# Patient Record
Sex: Male | Born: 1940 | Race: White | Hispanic: No | Marital: Married | Smoking: Never smoker
Health system: Southern US, Community
[De-identification: ages and names within clinical notes are randomized; demographics above are authoritative.]

## PROBLEM LIST (undated history)

## (undated) DIAGNOSIS — I714 Abdominal aortic aneurysm, without rupture, unspecified: Secondary | ICD-10-CM

## (undated) DIAGNOSIS — Z87442 Personal history of urinary calculi: Secondary | ICD-10-CM

## (undated) DIAGNOSIS — I4891 Unspecified atrial fibrillation: Secondary | ICD-10-CM

## (undated) DIAGNOSIS — I10 Essential (primary) hypertension: Secondary | ICD-10-CM

## (undated) DIAGNOSIS — G4730 Sleep apnea, unspecified: Secondary | ICD-10-CM

## (undated) HISTORY — PX: WISDOM TOOTH EXTRACTION: SHX21

## (undated) HISTORY — PX: NO PAST SURGERIES: SHX2092

---

## 2019-03-02 ENCOUNTER — Other Ambulatory Visit: Payer: Self-pay | Admitting: Neurological Surgery

## 2019-03-02 DIAGNOSIS — M4646 Discitis, unspecified, lumbar region: Secondary | ICD-10-CM

## 2019-03-18 ENCOUNTER — Ambulatory Visit
Admission: RE | Admit: 2019-03-18 | Discharge: 2019-03-18 | Disposition: A | Discharge status: Home / Self Care | Payer: Medicare Other | Source: Ambulatory Visit | Attending: Neurological Surgery | Admitting: Neurological Surgery

## 2019-03-18 DIAGNOSIS — M4646 Discitis, unspecified, lumbar region: Secondary | ICD-10-CM

## 2019-03-19 ENCOUNTER — Other Ambulatory Visit: Payer: Self-pay | Admitting: Neurological Surgery

## 2019-04-08 NOTE — Pre-Procedure Instructions (Signed)
Argle Mica  04/08/2019      [id number] - [place],  - [address] AT [facility] OF [address] [place] [address] Phone: [phone] Fax: [phone]    Your procedure is scheduled on 04/13/19.  Report to [facility] Admitting at 0930 A.M.  Call this number if you have problems the morning of surgery:  [phone]   Remember:  Do not eat or drink after midnight.     Take these medicines the morning of surgery with A SIP OF WATER ---PACERONE,METOPROLOL,OXCODONE,DOXYCYCLINE    Do not wear jewelry, make-up or nail polish.  Do not wear lotions, powders, or perfumes, or deodorant.  Do not shave 48 hours prior to surgery.  Men may shave face and neck.  Do not bring valuables to the hospital.  [facility] is not responsible for any belongings or valuables.  Contacts, dentures or bridgework may not be worn into surgery.  Leave your suitcase in the car.  After surgery it may be brought to your room.  For patients admitted to the hospital, discharge time will be determined by your treatment team.  Patients discharged the day of surgery will not be allowed to drive home.    Special instructions:  Do not take any aspirin,anti-inflammatories,vitamins,or herbal supplements 5-7 days prior to surgery. [facility] - Preparing for Surgery  Before surgery, you can play an important role.  Because skin is not sterile, your skin needs to be as free of germs as possible.  You can reduce the number of germs on you skin by washing with CHG (chlorahexidine gluconate) soap before surgery.  CHG is an antiseptic cleaner which kills germs and bonds with the skin to continue killing germs even after washing.  Oral Hygiene is also important in reducing the risk of infection.  Remember to brush your teeth with your regular toothpaste the morning of surgery.  Please DO NOT use if you have an allergy to CHG or antibacterial soaps.  If your skin  becomes reddened/irritated stop using the CHG and inform your nurse when you arrive at Short Stay.  Do not shave (including legs and underarms) for at least 48 hours prior to the first CHG shower.  You may shave your face.  Please follow these instructions carefully:   1.  Shower with CHG Soap the night before surgery and the morning of Surgery.  2.  If you choose to wash your hair, wash your hair first as usual with your normal shampoo.  3.  After you shampoo, rinse your hair and body thoroughly to remove the shampoo. 4.  Use CHG as you would any other liquid soap.  You can apply chg directly to the skin and wash gently with a      scrungie or washcloth.           5.  Apply the CHG Soap to your body ONLY FROM THE NECK DOWN.   Do not use on open wounds or open sores. Avoid contact with your eyes, ears, mouth and genitals (private parts).  Wash genitals (private parts) with your normal soap.  6.  Wash thoroughly, paying special attention to the area where your surgery will be performed.  7.  Thoroughly rinse your body with warm water from the neck down.  8.  DO NOT shower/wash with your normal soap after using and rinsing off the CHG Soap.  9.  Pat yourself dry with a  clean towel.            10.  Wear clean pajamas.            11.  Place clean sheets on your bed the night of your first shower and do not sleep with pets.  Day of Surgery  Do not apply any lotions/deoderants the morning of surgery.   Please wear clean clothes to the hospital/surgery center. Remember to brush your teeth with toothpaste.   Please read over the following fact sheets that you were given. MRSA Information

## 2019-04-09 ENCOUNTER — Encounter (HOSPITAL_COMMUNITY): Payer: Self-pay

## 2019-04-09 ENCOUNTER — Ambulatory Visit (HOSPITAL_COMMUNITY)
Admission: RE | Admit: 2019-04-09 | Discharge: 2019-04-09 | Disposition: A | Discharge status: Home / Self Care | Payer: Medicare Other | Source: Ambulatory Visit | Attending: Neurological Surgery | Admitting: Neurological Surgery

## 2019-04-09 ENCOUNTER — Encounter (HOSPITAL_COMMUNITY)
Admission: RE | Admit: 2019-04-09 | Discharge: 2019-04-09 | Disposition: A | Discharge status: Home / Self Care | Payer: Medicare Other | Source: Ambulatory Visit | Attending: Neurological Surgery | Admitting: Neurological Surgery

## 2019-04-09 ENCOUNTER — Other Ambulatory Visit (HOSPITAL_COMMUNITY)
Admission: RE | Admit: 2019-04-09 | Discharge: 2019-04-09 | Disposition: A | Discharge status: Home / Self Care | Payer: Medicare Other | Source: Ambulatory Visit | Attending: Neurological Surgery | Admitting: Neurological Surgery

## 2019-04-09 ENCOUNTER — Other Ambulatory Visit: Payer: Self-pay

## 2019-04-09 DIAGNOSIS — M4640 Discitis, unspecified, site unspecified: Secondary | ICD-10-CM | POA: Insufficient documentation

## 2019-04-09 DIAGNOSIS — Z20828 Contact with and (suspected) exposure to other viral communicable diseases: Secondary | ICD-10-CM | POA: Diagnosis not present

## 2019-04-09 HISTORY — DX: Sleep apnea, unspecified: G47.30

## 2019-04-09 HISTORY — DX: Abdominal aortic aneurysm, without rupture: I71.4

## 2019-04-09 HISTORY — DX: Abdominal aortic aneurysm, without rupture, unspecified: I71.40

## 2019-04-09 HISTORY — DX: Essential (primary) hypertension: I10

## 2019-04-09 LAB — CBC WITH DIFFERENTIAL/PLATELET
Abs Immature Granulocytes: 0.04 10*3/uL (ref 0.00–0.07)
Basophils Absolute: 0.1 10*3/uL (ref 0.0–0.1)
Basophils Relative: 1 %
Eosinophils Absolute: 0.3 10*3/uL (ref 0.0–0.5)
Eosinophils Relative: 4 %
HCT: 29.2 % — ABNORMAL LOW (ref 39.0–52.0)
Hemoglobin: 9.1 g/dL — ABNORMAL LOW (ref 13.0–17.0)
Immature Granulocytes: 1 %
Lymphocytes Relative: 18 %
Lymphs Abs: 1.2 10*3/uL (ref 0.7–4.0)
MCH: 26 pg (ref 26.0–34.0)
MCHC: 31.2 g/dL (ref 30.0–36.0)
MCV: 83.4 fL (ref 80.0–100.0)
Monocytes Absolute: 0.6 10*3/uL (ref 0.1–1.0)
Monocytes Relative: 10 %
Neutro Abs: 4.4 10*3/uL (ref 1.7–7.7)
Neutrophils Relative %: 66 %
Platelets: 188 10*3/uL (ref 150–400)
RBC: 3.5 MIL/uL — ABNORMAL LOW (ref 4.22–5.81)
RDW: 16.8 % — ABNORMAL HIGH (ref 11.5–15.5)
WBC: 6.6 10*3/uL (ref 4.0–10.5)
nRBC: 0 % (ref 0.0–0.2)

## 2019-04-09 LAB — BASIC METABOLIC PANEL
Anion gap: 7 (ref 5–15)
BUN: 24 mg/dL — ABNORMAL HIGH (ref 8–23)
CO2: 26 mmol/L (ref 22–32)
Calcium: 9.9 mg/dL (ref 8.9–10.3)
Chloride: 103 mmol/L (ref 98–111)
Creatinine, Ser: 1.21 mg/dL (ref 0.61–1.24)
GFR calc Af Amer: >60 mL/min (ref >60)
GFR calc non Af Amer: 57 mL/min — ABNORMAL LOW (ref >60)
Glucose, Bld: 100 mg/dL — ABNORMAL HIGH (ref 70–99)
Potassium: 4.5 mmol/L (ref 3.5–5.1)
Sodium: 136 mmol/L (ref 135–145)

## 2019-04-09 LAB — PROTIME-INR
INR: 1.5 — ABNORMAL HIGH (ref 0.8–1.2)
Prothrombin Time: 17.7 seconds — ABNORMAL HIGH (ref 11.4–15.2)

## 2019-04-09 LAB — SURGICAL PCR SCREEN
MRSA, PCR: NEGATIVE
Staphylococcus aureus: NEGATIVE

## 2019-04-09 LAB — SARS CORONAVIRUS 2 (TAT 6-24 HRS): SARS Coronavirus 2: NEGATIVE

## 2019-04-09 MED ORDER — CHLORHEXIDINE GLUCONATE CLOTH 2 % EX PADS: 6.0000 | MEDICATED_PAD | Freq: Once | CUTANEOUS | Status: DC

## 2019-04-09 NOTE — Final Progress Note (Signed)
PCP - jeffery vanderbury with [facility]--will call for cardiac clearance Cardiologist - na Chest x-ray - 7/20 EKG - 7/20 Stress Test - na ECHO na-  Cardiac Cath - na  Sleep Study - yes CPAP yes -   Blood Thinner Instructions:  Stop xarelto 7/29 Aspirin Instructions:stop  Anesthesia review: review heart hx  Patient denies shortness of breath, fever, cough and chest pain at PAT appointment   Patient verbalized understanding of instructions that were given to them at the PAT appointment. Patient was also instructed that they will need to review over the PAT instructions again at home before surgery.

## 2019-04-10 ENCOUNTER — Encounter (HOSPITAL_COMMUNITY): Payer: Self-pay | Admitting: Vascular Surgery

## 2019-04-10 ENCOUNTER — Encounter (HOSPITAL_COMMUNITY): Payer: Self-pay

## 2019-04-10 ENCOUNTER — Encounter (HOSPITAL_COMMUNITY): Payer: Self-pay | Admitting: Physician Assistant

## 2019-04-10 NOTE — Progress Notes (Addendum)
Anesthesia Chart Review:  Case: [id number] Date/Time: 04/13/19 1112   Procedure: Posterior Lateral Fusion with segmental instrumentation T10-L4 (N/A )   Anesthesia type: General   Pre-op diagnosis: Discitis   Location: [facility] OR ROOM 59 / [facility]   Surgeon: Eustace Moore, MD      DISCUSSION: 78 yo male with pertinent hx of HTN, OSA on CPAP, Afib (new onset 01/2019), recent admission for complicated disseminated staph infection.   Admitted to [place] 4/25-01/12/19 for disseminated Staph epidermidis infection (pyelonephritis, bacteremia, epidural abscess with discitis/osteomyelitis). 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 He completed 8-week course of cefazolin on 03/04/2019 and was transitioned to oral continuation therapy with doxycycline thereafter. He has continued to follow with ID and Urology.  During hospitalization he was found to have new onset Afib with RVR. Cardiology was consulted. CTA chest showed prominent coronary artery calcifications. Echo showed reduced LV function with EF 30-35%. Unclear if depressed EF due to Afib or ischemia. Notes during admission state he may need outpatient ischemic workup and/or cardioversion. He was started on metoprolol and xarelto and advised to f/u outpatient with cardiology but has not yet done so.   AAA listed in diagnosis history, however CT abdomen 01/03/19 showed atherosclerosis but no aneurysm.   Discussed case with Dr. Tobias Alexander. He advised that ideally the pt would be evaluated by cardiology given new onset afib and depressed EF with significant coronary calcification seen on CT. However, if the pt is at risk for imminent neurologic decline and the surgery is deemed emergent by Dr. Ronnald Ramp they could  proceed as planned but will need DOS eval by anesthesia.   Spoke with Lorriane Shire at Dr. Ronnald Ramp' office, she relayed that Dr. Ronnald Ramp did feel this case needed to be done soon. She will discuss with him regarding proceeding.   Preop labs notable for Hgb 9.1. Type and screen should have been done at PAT but was evidently not ordered. Will need to be done DOS.    VS: BP (!) 98/57   Pulse 67   Temp (!) 36.3 C (Oral)   Resp 18   Ht 6' (1.829 m)   Wt 99.1 kg   SpO2 98%   BMI 29.62 kg/m   PROVIDERS: Mackey Birchwood, NP is PCP  Dionne Milo, MD is Pulmonologist  Sharene Skeans, MD is ID  LABS: Anemia noted (all labs ordered are listed, but only abnormal results are displayed)  Labs Reviewed  BASIC METABOLIC PANEL - Abnormal; Notable for the following components:      Result Value   Glucose, Bld 100 (*)    BUN 24 (*)    GFR calc non Af Amer 57 (*)    All other components within normal limits  CBC WITH DIFFERENTIAL/PLATELET - Abnormal; Notable for the following components:   RBC 3.50 (*)    Hemoglobin 9.1 (*)    HCT 29.2 (*)    RDW 16.8 (*)    All other components within normal limits  PROTIME-INR - Abnormal; Notable for the following components:   Prothrombin Time 17.7 (*)    INR 1.5 (*)    All other components within normal limits  SURGICAL PCR SCREEN     IMAGES: CHEST - 2 VIEW 04/09/2019:  COMPARISON:  None  FINDINGS:  Enlargement of cardiac silhouette.  Atherosclerotic calcification and elongation of thoracic aorta.  Mediastinal contours and pulmonary vascularity otherwise normal.  Minimal bibasilar atelectasis.  Lungs otherwise clear.  No pleural effusion or pneumothorax.  Bones demineralized.  IMPRESSION: Minimal bibasilar atelectasis.  Enlargement of cardiac silhouette.  Lumbar MRI 03/18/2019: IMPRESSION: 1. Destructive changes of the majority of the L1 vertebra with vertebral body collapse, as seen on the prior MRI, most  consistent with sequela of spondylo-discitis. There is approximately 12 mm retropulsion of bone and soft tissues at the level of L1-L2 with associated moderate focal narrowing of the central canal. These findings are similar to the MRI of 02/18/2019. 2. No new/acute fractures identified. 3. Multilevel degenerative disc disease including diffuse disc bulge at L3-L4 with associated bilateral neural foramina narrowing. 4. Osteopenia. 5. Multiple nonobstructing right renal calculi as well as a nonobstructing stone in the mid right ureter. Moderate right hydronephrosis. Partially visualized right ureteral stent with proximal tip in the region of the right UPJ. 6.  Aortic Atherosclerosis (ICD10-I70.0).  EKG: 04/09/2019: Atrial fibrillation. Rate 64. Left axis deviation. Right bundle branch block  CV: TTE 01/04/19 (care everywhere): The left ventricle is normal in size. There is no thrombus. There is normal left ventricular wall thickness. The left ventricular ejection fraction is markedly reduced (30-35%). Unable to adequately determine diastolic dysfunction. There is mild (1+) mitral regurgitation. The aortic valve is trileaflet. Mild aortic sclerosis is present with good valvular opening. There is mild (1+) tricuspid regurgitation. The left atrium is mildly dilated.   Past Medical History:  Diagnosis Date  . AAA (abdominal aortic aneurysm) (HCC)   . Hypertension   . Kidney stones   . Sleep apnea    CPAP    Past Surgical History:  Procedure Laterality Date  . NO PAST SURGERIES      MEDICATIONS: . amiodarone (PACERONE) 200 MG tablet  . atorvastatin (LIPITOR) 40 MG tablet  . bisacodyl (DULCOLAX) 5 MG EC tablet  . doxycycline (VIBRAMYCIN) 100 MG capsule  . furosemide (LASIX) 40 MG tablet  . latanoprost (XALATAN) 0.005 % ophthalmic solution  . metoprolol succinate (TOPROL-XL) 25 MG 24 hr tablet  . metoprolol succinate (TOPROL-XL) 50 MG 24 hr tablet  . Multiple Vitamin  (MULTIVITAMIN WITH MINERALS) TABS tablet  . Oxycodone HCl 10 MG TABS  . polyethylene glycol (MIRALAX / GLYCOLAX) 17 g packet  . rivaroxaban (XARELTO) 20 MG TABS tablet   No current facility-administered medications for this encounter.

## 2019-04-10 NOTE — Progress Notes (Signed)
See note by Karoline Caldwell, PA-C. Voice message received from [place] after 5:00 PM. Dr. Ronnald Ramp is leaving case on the schedule (for 04/13/2019) to "assess day of surgery."   Myra Gianotti, PA-C Surgical Short Stay/Anesthesiology [facility] Phone [phone] [facility] Phone [phone] 04/10/2019 6:42 PM

## 2019-04-10 NOTE — Anesthesia Preprocedure Evaluation (Deleted)
Anesthesia Evaluation    Airway        Dental   Pulmonary           Cardiovascular hypertension,      Neuro/Psych    GI/Hepatic   Endo/Other    Renal/GU      Musculoskeletal   Abdominal   Peds  Hematology   Anesthesia Other Findings   Reproductive/Obstetrics                             Anesthesia Physical Anesthesia Plan  ASA:   Anesthesia Plan:    Post-op Pain Management:    Induction:   PONV Risk Score and Plan:   Airway Management Planned:   Additional Equipment:   Intra-op Plan:   Post-operative Plan:   Informed Consent:   Plan Discussed with:   Anesthesia Plan Comments: (See PAT note by Karoline Caldwell, PA-C. Case cancelled for Cardiology w/u. )      Anesthesia Quick Evaluation

## 2019-04-13 ENCOUNTER — Encounter (HOSPITAL_COMMUNITY): Payer: Self-pay | Admitting: Certified Registered Nurse Anesthetist

## 2019-04-13 ENCOUNTER — Inpatient Hospital Stay (HOSPITAL_COMMUNITY)
Admission: RE | Admit: 2019-04-13 | Discharge: 2019-04-18 | DRG: 457 | Disposition: A | Discharge status: Home Health | Payer: Medicare Other | Attending: Neurological Surgery | Admitting: Neurological Surgery

## 2019-04-13 ENCOUNTER — Encounter (HOSPITAL_COMMUNITY)
Admission: RE | Disposition: A | Discharge status: Home Health | Payer: Self-pay | Source: Home / Self Care | Attending: Neurological Surgery

## 2019-04-13 ENCOUNTER — Inpatient Hospital Stay (HOSPITAL_COMMUNITY): Payer: Medicare Other

## 2019-04-13 ENCOUNTER — Other Ambulatory Visit: Payer: Self-pay

## 2019-04-13 DIAGNOSIS — I110 Hypertensive heart disease with heart failure: Secondary | ICD-10-CM | POA: Diagnosis present

## 2019-04-13 DIAGNOSIS — I4891 Unspecified atrial fibrillation: Secondary | ICD-10-CM | POA: Diagnosis present

## 2019-04-13 DIAGNOSIS — M4646 Discitis, unspecified, lumbar region: Secondary | ICD-10-CM | POA: Diagnosis present

## 2019-04-13 DIAGNOSIS — I5022 Chronic systolic (congestive) heart failure: Secondary | ICD-10-CM | POA: Diagnosis present

## 2019-04-13 DIAGNOSIS — I4821 Permanent atrial fibrillation: Secondary | ICD-10-CM | POA: Diagnosis not present

## 2019-04-13 DIAGNOSIS — Z01810 Encounter for preprocedural cardiovascular examination: Secondary | ICD-10-CM | POA: Diagnosis not present

## 2019-04-13 DIAGNOSIS — M4640 Discitis, unspecified, site unspecified: Secondary | ICD-10-CM | POA: Diagnosis present

## 2019-04-13 DIAGNOSIS — I4811 Longstanding persistent atrial fibrillation: Secondary | ICD-10-CM | POA: Diagnosis not present

## 2019-04-13 DIAGNOSIS — I252 Old myocardial infarction: Secondary | ICD-10-CM

## 2019-04-13 DIAGNOSIS — M40205 Unspecified kyphosis, thoracolumbar region: Secondary | ICD-10-CM | POA: Diagnosis present

## 2019-04-13 DIAGNOSIS — G4730 Sleep apnea, unspecified: Secondary | ICD-10-CM | POA: Diagnosis present

## 2019-04-13 DIAGNOSIS — Z87442 Personal history of urinary calculi: Secondary | ICD-10-CM | POA: Diagnosis not present

## 2019-04-13 DIAGNOSIS — Z419 Encounter for procedure for purposes other than remedying health state, unspecified: Secondary | ICD-10-CM

## 2019-04-13 DIAGNOSIS — I429 Cardiomyopathy, unspecified: Secondary | ICD-10-CM

## 2019-04-13 DIAGNOSIS — I428 Other cardiomyopathies: Secondary | ICD-10-CM | POA: Diagnosis present

## 2019-04-13 DIAGNOSIS — Z8679 Personal history of other diseases of the circulatory system: Secondary | ICD-10-CM

## 2019-04-13 DIAGNOSIS — M545 Low back pain: Secondary | ICD-10-CM | POA: Diagnosis present

## 2019-04-13 DIAGNOSIS — Z79899 Other long term (current) drug therapy: Secondary | ICD-10-CM | POA: Diagnosis not present

## 2019-04-13 DIAGNOSIS — Z7901 Long term (current) use of anticoagulants: Secondary | ICD-10-CM

## 2019-04-13 DIAGNOSIS — Z981 Arthrodesis status: Secondary | ICD-10-CM

## 2019-04-13 HISTORY — DX: Unspecified atrial fibrillation: I48.91

## 2019-04-13 HISTORY — DX: Personal history of urinary calculi: Z87.442

## 2019-04-13 LAB — ECHOCARDIOGRAM COMPLETE
Height: 72 in
Weight: 3280 oz

## 2019-04-13 SURGERY — LAMINECTOMY WITH POSTERIOR LATERAL ARTHRODESIS LEVEL 4
Anesthesia: General

## 2019-04-13 MED ORDER — POTASSIUM CHLORIDE IN NACL 20-0.9 MEQ/L-% IV SOLN
INTRAVENOUS | Status: DC
  Administered 2019-04-13: 15:00:00 via INTRAVENOUS
  Filled 2019-04-13: qty 1000

## 2019-04-13 MED ORDER — METOPROLOL SUCCINATE ER 50 MG PO TB24
50.0000 mg | ORAL_TABLET | Freq: Every day | ORAL | Status: DC
  Administered 2019-04-14 – 2019-04-18 (×4): 50 mg via ORAL
  Filled 2019-04-13 (×7): qty 1

## 2019-04-13 MED ORDER — FUROSEMIDE 40 MG PO TABS
40.0000 mg | ORAL_TABLET | Freq: Every day | ORAL | Status: DC
  Administered 2019-04-13 – 2019-04-16 (×4): 40 mg via ORAL
  Filled 2019-04-13 (×4): qty 1

## 2019-04-13 MED ORDER — DOXYCYCLINE HYCLATE 100 MG PO TABS
100.0000 mg | ORAL_TABLET | Freq: Two times a day (BID) | ORAL | Status: DC
  Administered 2019-04-13 – 2019-04-18 (×11): 100 mg via ORAL
  Filled 2019-04-13 (×11): qty 1

## 2019-04-13 MED ORDER — ADULT MULTIVITAMIN W/MINERALS CH
1.0000 | ORAL_TABLET | Freq: Every day | ORAL | Status: DC
  Administered 2019-04-13 – 2019-04-18 (×5): 1 via ORAL
  Filled 2019-04-13 (×5): qty 1

## 2019-04-13 MED ORDER — ACETAMINOPHEN 650 MG RE SUPP: 650.0000 mg | Freq: Four times a day (QID) | RECTAL | Status: DC | PRN

## 2019-04-13 MED ORDER — CEFAZOLIN SODIUM-DEXTROSE 2-4 GM/100ML-% IV SOLN
INTRAVENOUS | Status: AC
  Filled 2019-04-13: qty 100

## 2019-04-13 MED ORDER — ACETAMINOPHEN 325 MG PO TABS: 650.0000 mg | ORAL_TABLET | Freq: Four times a day (QID) | ORAL | Status: DC | PRN

## 2019-04-13 MED ORDER — SODIUM CHLORIDE 0.9% FLUSH
3.0000 mL | Freq: Two times a day (BID) | INTRAVENOUS | Status: DC
  Administered 2019-04-13 (×2): 3 mL via INTRAVENOUS

## 2019-04-13 MED ORDER — PERFLUTREN LIPID MICROSPHERE
1.0000 mL | INTRAVENOUS | Status: AC | PRN
  Filled 2019-04-13: qty 10

## 2019-04-13 MED ORDER — LATANOPROST 0.005 % OP SOLN
1.0000 [drp] | Freq: Every day | OPHTHALMIC | Status: DC
  Administered 2019-04-13 – 2019-04-17 (×5): 1 [drp] via OPHTHALMIC
  Filled 2019-04-13: qty 2.5

## 2019-04-13 MED ORDER — PERFLUTREN LIPID MICROSPHERE
INTRAVENOUS | Status: AC
  Filled 2019-04-13: qty 10

## 2019-04-13 MED ORDER — REGADENOSON 0.4 MG/5ML IV SOLN
0.4000 mg | Freq: Once | INTRAVENOUS | Status: AC
  Administered 2019-04-14: 0.4 mg via INTRAVENOUS
  Filled 2019-04-13: qty 5

## 2019-04-13 MED ORDER — AMIODARONE HCL 200 MG PO TABS
200.0000 mg | ORAL_TABLET | Freq: Every day | ORAL | Status: DC
  Administered 2019-04-13: 15:00:00 200 mg via ORAL
  Filled 2019-04-13: qty 1

## 2019-04-13 MED ORDER — METOPROLOL SUCCINATE ER 25 MG PO TB24
25.0000 mg | ORAL_TABLET | Freq: Every day | ORAL | Status: DC
  Administered 2019-04-14 – 2019-04-16 (×2): 25 mg via ORAL
  Filled 2019-04-13 (×4): qty 1

## 2019-04-13 MED ORDER — CEFAZOLIN SODIUM-DEXTROSE 2-4 GM/100ML-% IV SOLN: 2.0000 g | INTRAVENOUS | Status: DC

## 2019-04-13 NOTE — Progress Notes (Signed)
Patient to self-administer CPAP for HS using his machine from home.  Patient is familiar with equipment and procedure.  His machine is ready for use and within reach.  Patient informed to contact RN or RT with any further needs or requests.

## 2019-04-13 NOTE — Progress Notes (Signed)
  Echocardiogram 2D Echocardiogram has been performed.  Jake Johnson 04/13/2019, 2:16 PM

## 2019-04-13 NOTE — H&P (Signed)
Subjective: Patient is a 78 y.o. male admitted for L1-2 diskitis and vertebral body destruction. Onset of symptoms was several weeks ago, gradually worsening since that time.  The pain is rated severe, and is located at the across the lower back. The pain is described as aching and occurs all day. The symptoms have been progressive. Symptoms are exacerbated by exercise. MRI or CT showed L1-2 diskitis with vertebral destruction   Past Medical History:  Diagnosis Date  . AAA (abdominal aortic aneurysm) ([place])   . Hypertension   . Kidney stones   . Sleep apnea    CPAP    Past Surgical History:  Procedure Laterality Date  . NO PAST SURGERIES      Prior to Admission medications   Medication Sig Start Date End Date Taking? Authorizing Provider  amiodarone (PACERONE) 200 MG tablet Take 200 mg by mouth daily.   Yes [provider]  atorvastatin (LIPITOR) 40 MG tablet Take 40 mg by mouth at bedtime.   Yes [provider]  bisacodyl (DULCOLAX) 5 MG EC tablet Take 5 mg by mouth daily.   Yes [provider]  doxycycline (VIBRAMYCIN) 100 MG capsule Take 100 mg by mouth 2 (two) times daily.   Yes [provider]  furosemide (LASIX) 40 MG tablet Take 40 mg by mouth daily.   Yes [provider]  latanoprost (XALATAN) 0.005 % ophthalmic solution Place 1 drop into both eyes at bedtime.   Yes [provider]  metoprolol succinate (TOPROL-XL) 25 MG 24 hr tablet Take 25 mg by mouth at bedtime.   Yes [provider]  metoprolol succinate (TOPROL-XL) 50 MG 24 hr tablet Take 50 mg by mouth daily.   Yes [provider]  Multiple Vitamin (MULTIVITAMIN WITH MINERALS) TABS tablet Take 1 tablet by mouth daily.   Yes [provider]  Oxycodone HCl 10 MG TABS Take 10 mg by mouth every 6 (six) hours as needed (pain).   Yes [provider]  polyethylene glycol (MIRALAX / GLYCOLAX) 17 g packet Take 17 g by mouth daily.   Yes [provider]  rivaroxaban (XARELTO) 20 MG TABS tablet Take 20 mg by mouth daily.   Yes [provider]   No Known Allergies  Social History   Tobacco Use  . Smoking status: Not on file  Substance Use Topics  . Alcohol use: Never    Frequency: Never    History reviewed. No pertinent family history.   Review of Systems  Positive ROS: neg  All other systems have been reviewed and were otherwise negative with the exception of those mentioned in the HPI and as above.  Objective: Vital signs in last 24 hours: Temp:  [98.3 F (36.8 C)] 98.3 F (36.8 C) (08/03 0933) Pulse Rate:  [72] 72 (08/03 0933) Resp:  [18] 18 (08/03 0933) BP: (128)/(50) 128/50 (08/03 0933) SpO2:  [100 %] 100 % (08/03 0933) Weight:  [93 kg] 93 kg (08/03 0933)  General Appearance: Alert, cooperative, no distress, appears stated age Head: Normocephalic, without obvious abnormality, atraumatic Eyes: PERRL, conjunctiva/corneas clear, EOM's intact    Neck: Supple, symmetrical, trachea midline Back: Symmetric, no curvature, ROM normal, no CVA tenderness Lungs:  respirations unlabored Heart: Regular rate and rhythm Abdomen: Soft, non-tender Extremities: Extremities normal, atraumatic, no cyanosis or edema Pulses: 2+ and symmetric all extremities Skin: Skin color, texture, turgor normal, no rashes or lesions  NEUROLOGIC:   Mental status: Alert and oriented x4,  no aphasia, good  attention span, fund of knowledge, and memory Motor Exam - grossly normal Sensory Exam - grossly normal Reflexes: trace Coordination - grossly normal Gait - grossly normal Balance - grossly normal Cranial Nerves: I: smell Not tested  II: visual acuity  OS: nl    OD: nl  II: visual fields Full to confrontation  II: pupils Equal, round, reactive to light  III,VII: ptosis None  III,IV,VI: extraocular muscles  Full ROM  V: mastication Normal  V: facial light touch sensation  Normal  V,VII: corneal reflex  Present  VII:  facial muscle function - upper  Normal  VII: facial muscle function - lower Normal  VIII: hearing Not tested  IX: soft palate elevation  Normal  IX,X: gag reflex Present  XI: trapezius strength  5/5  XI: sternocleidomastoid strength 5/5  XI: neck flexion strength  5/5  XII: tongue strength  Normal    Data Review Lab Results  Component Value Date   WBC 6.6 04/09/2019   HGB 9.1 (L) 04/09/2019   HCT 29.2 (L) 04/09/2019   MCV 83.4 04/09/2019   PLT 188 04/09/2019   Lab Results  Component Value Date   NA 136 04/09/2019   K 4.5 04/09/2019   CL 103 04/09/2019   CO2 26 04/09/2019   BUN 24 (H) 04/09/2019   CREATININE 1.21 04/09/2019   GLUCOSE 100 (H) 04/09/2019   Lab Results  Component Value Date   INR 1.5 (H) 04/09/2019    Assessment/Plan:  Estimated body mass index is 27.8 kg/m as calculated from the following:   Height as of this encounter: 6' (1.829 m).   Weight as of this encounter: 93 kg. Patient admitted for sequelae of L1-2 diskitis. Surgery will be delayed for cardiac work up and clearance. He will be admitted for this.  Patient has failed a reasonable attempt at conservative therapy and will ned stabilization surgery when cleared.Tia Alert 04/13/2019 11:05 AM

## 2019-04-13 NOTE — Consult Note (Addendum)
Cardiology Consultation:   Patient ID: Jake DuffWilliam Johnson MRN: [id number]; DOB: 03/25/1941  Admit date: 04/13/2019 Date of Consult: 04/13/2019  Primary Care Provider: Donzetta SprungVanderburg, Jeffrey, NP Primary Cardiologist: New to Dr. Antoine PocheHochrein   Patient Profile:   Jake DuffWilliam Johnson is a 78 y.o. male with a hx hypertension, sleep apnea previously on CPAP, AAA, kidney stone and recent diagnosis of atrial fibrillation of  who is being seen today for the evaluation of surgical clearance at the request of Dr. Yetta BarreJones.   He was [facility] 12/[facility] at [facility] for sepsis secondary to bacteremia, epidural abscess with osteomyelitis and pyelonephritis.  He was diagnosed with atrial fibrillation.  Cardiogram showed reduced LV function at 30 to 35%, mild mitral regurgitation.  He was anticoagulated with Eliquis.  No ischemic work-up done.  History of Present Illness:   Jake Johnson continue to have lower lumbar back pain.  Failed conservative therapy.  He was started on CPAP for sleep apnea by Randolm IdolAdnan Javaid, MD. 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  Due to cardiac risk factor, his surgery cancel and cardiology asked for surgical clearance.  Patient reports being very active prior to 6 months ago.  He was playing tennis and post moving yard without any issue.  No prior cardiac history.  Denies alcohol, tobacco or illicit drug use.  No orthopnea, PND, syncope, lower extremity edema, dizziness, chest pain or palpitation.  Father had pacemaker placement.  Heart Pathway Score:     Past Medical History:  Diagnosis Date  . AAA (abdominal aortic aneurysm) (HCC)   . Hypertension   . Kidney stones   . Sleep apnea    Does not use CPAP    Past Surgical History:  Procedure Laterality Date  . NO PAST SURGERIES       Inpatient Medications: Scheduled Meds: . amiodarone  200 mg Oral Daily  . doxycycline  100 mg Oral BID  . furosemide  40 mg Oral Daily  . latanoprost  1 drop Both Eyes QHS   . metoprolol succinate  25 mg Oral QHS  . [START ON 04/14/2019] metoprolol succinate  50 mg Oral Daily  . multivitamin with minerals  1 tablet Oral Daily  . sodium chloride flush  3 mL Intravenous Q12H   Continuous Infusions: . 0.9 % NaCl with KCl 20 mEq / L     PRN Meds: acetaminophen **OR** acetaminophen  Allergies:   No Known Allergies  Social History:   Social History   Socioeconomic History  . Marital status: Married    Spouse name: Not on file  . Number of children: Not on file  . Years of education: Not on file  . Highest education level: Not on file  Occupational History  . Not on file  Social Needs  . Financial resource strain: Not on file  . Food insecurity    Worry: Not on file    Inability: Not on file  . Transportation needs    Medical: Not on file    Non-medical: Not on file  Tobacco Use  . Smoking status: Never Smoker  . Smokeless tobacco: Never Used  Substance and Sexual Activity  . Alcohol use: Never    Frequency: Never  . Drug use: Never  . Sexual activity: Not on file  Lifestyle  . Physical activity    Days per week: Not on file    Minutes per session: Not on file  . Stress: Not on file  Relationships  . Social connections  Talks on phone: Not on file    Gets together: Not on file    Attends religious service: Not on file    Active member of club or organization: Not on file    Attends meetings of clubs or organizations: Not on file    Relationship status: Not on file  . Intimate partner violence    Fear of current or ex partner: Not on file    Emotionally abused: Not on file    Physically abused: Not on file    Forced sexual activity: Not on file  Other Topics Concern  . Not on file  Social History Narrative  . Not on file    Family History:   As above  ROS:  Please see the history of present illness.  All other ROS reviewed and negative.     Physical Exam/Data:   Vitals:   04/13/19 0933 04/13/19 1247  BP: (!) 128/50  107/75  Pulse: 72 (!) 56  Resp: 18 18  Temp: 98.3 F (36.8 C) 97.9 F (36.6 C)  TempSrc: Oral Oral  SpO2: 100% 100%  Weight: 93 kg   Height: 6' (1.829 m)    No intake or output data in the 24 hours ending 04/13/19 1334 Last 3 Weights 04/13/2019 04/09/2019  Weight (lbs) 205 lb 218 lb 6 oz  Weight (kg) 92.987 kg 99.054 kg     Body mass index is 27.8 kg/m.  General:  Well nourished, well developed, in no acute distress HEENT: normal Lymph: no adenopathy Neck: no JVD Endocrine:  No thryomegaly Vascular: No carotid bruits; FA pulses 2+ bilaterally without bruits  Cardiac:  normal S1, S2; RRR; no murmur  Lungs:  clear to auscultation bilaterally, no wheezing, rhonchi or rales  Abd: soft, nontender, no hepatomegaly  Ext: no edema Musculoskeletal:  No deformities, BUE and BLE strength normal and equal Skin: warm and dry  Neuro:  CNs 2-12 intact, no focal abnormalities noted Psych:  Normal affect   EKG:  The EKG was personally reviewed and demonstrates: Atrial fibrillation at rate of 84 bpm, right bundle branch block Telemetry:  Telemetry was personally reviewed and demonstrates: Atrial fibrillation at controlled ventricular rate  Relevant CV Studies:  Echo 12/2018 Interpretation Summary A complete portable two-dimensional transthoracic echocardiogram with color flow Doppler and Spectral Doppler was performed. The study was technically adequate. There is no comparison study available. Rhythm appears to be atrial fibrillatioin with bundle branch block and ventricular ectopy.  The left ventricle is normal in size. There is no thrombus. There is normal left ventricular wall thickness. The left ventricular ejection fraction is markedly reduced (30-35%). Unable to adequately determine diastolic dysfunction. There is mild (1+) mitral regurgitation. The aortic valve is trileaflet. Mild aortic sclerosis is present with good valvular opening. There is mild (1+) tricuspid regurgitation.  The left atrium is mildly dilated.  Laboratory Data:   Chemistry Recent Labs  Lab 04/09/19 1356  NA 136  K 4.5  CL 103  CO2 26  GLUCOSE 100*  BUN 24*  CREATININE 1.21  CALCIUM 9.9  GFRNONAA 57*  GFRAA >60  ANIONGAP 7    Hematology Recent Labs  Lab 04/09/19 1356  WBC 6.6  RBC 3.50*  HGB 9.1*  HCT 29.2*  MCV 83.4  MCH 26.0  MCHC 31.2  RDW 16.8*  PLT 188    Radiology/Studies:  Chest 2 View  Result Date: 04/09/2019 CLINICAL DATA:  Diskitis, back surgery Monday EXAM: CHEST - 2 VIEW COMPARISON:  None FINDINGS: Enlargement  of cardiac silhouette. Atherosclerotic calcification and elongation of thoracic aorta. Mediastinal contours and pulmonary vascularity otherwise normal. Minimal bibasilar atelectasis. Lungs otherwise clear. No pleural effusion or pneumothorax. Bones demineralized. IMPRESSION: Minimal bibasilar atelectasis. Enlargement of cardiac silhouette. Electronically Signed   By: Ulyses Southward M.D.   On: 04/09/2019 17:54    Assessment and Plan:   1. Persistent atrial fibrillation Diagnosed 12/2018.  Rate controlled on amiodarone 200 mg daily and Toprol XL 50 mg a.m. and 25 mg p.m.  CHADSVASC score of 3 for age and HTN. Previously on Eliquis, currently on Xarelto 20 mg daily (last dose Thursday  7/30).  2. HTN - BP stable on current medications  3. Chronic systolic CHF -LV function of 30-35% at outside hospital 12/2018.  Currently euvolemic. -Continue Lasix and beta-blocker -Consider adding ACE/ARB as outpatient or after surgery as blood pressure allows - Pending echo here -Suspects nonischemic cardiomyopathy  4.  Preop clearance -Recent finding of atrial fibrillation with reduced LV function to 30 to 35%.  Denies prior cardiac history.  Patient was very active last year playing tennis and doing push mowing the yard.  However unable to do so recently due to severe back issue.  Will get chemical stress test tomorrow prior to final surgical clearance.   For  questions or updates, please contact [facility] Please consult [email or web address] for contact info under   SignedManson Passey, PA  04/13/2019 1:34 PM   History and all data above reviewed.  Patient examined.  I agree with the findings as above. The patient has no past cardiac history.  He does have atrial fib which was noted this year.  He was at [facility] and was treated for sepsis.  At that time he was also noted to have a reduced EF.  He is not aware of this.  The patient denies any new symptoms such as chest discomfort, neck or arm discomfort. There has been no new shortness of breath, PND or orthopnea. There have been no reported palpitations, presyncope or syncope.  He would not notice that he was in fib. He is limited by his back but walks up stairs with a cain.    The patient exam reveals RUE:[id number]  ,  Lungs: Clear  ,  Abd: Positive bowel sounds, no rebound no guarding, Ext No edema  .  All available labs, radiology testing, previous records reviewed. Agree with documented assessment and plan. Atrial fib:  Rate controlled and no symptoms.  I will not likely plan any other strategy besides anticoagulation and rate control.  Cardiomyopathy:  EF was low at the time of an admission for sepsis.  Repeat echo is pending.  No prior ischemia work up.  He does need to have ischemia ruled out so I will suggest [facility] tomorrow prior to any elective back surgery.    Fayrene Fearing Nolan Lasser  1:51 PM  04/13/2019

## 2019-04-13 NOTE — Progress Notes (Signed)
Attempt to place peripheral iv failed. Iv team consult placed, however did not arrived at the time needed for ECHO denifity administration.  Iv placement attempted by second unit nurse and was successful. Called ECHO to let them know that iv is in place if needed.

## 2019-04-14 ENCOUNTER — Inpatient Hospital Stay (HOSPITAL_COMMUNITY): Payer: Medicare Other

## 2019-04-14 DIAGNOSIS — I4821 Permanent atrial fibrillation: Secondary | ICD-10-CM

## 2019-04-14 DIAGNOSIS — I4891 Unspecified atrial fibrillation: Secondary | ICD-10-CM

## 2019-04-14 LAB — NM MYOCAR MULTI W/SPECT W/WALL MOTION / EF
Estimated workload: 1 METS
MPHR: 141 {beats}/min
Peak HR: 84 {beats}/min
Percent HR: 59 %
Rest HR: 64 {beats}/min

## 2019-04-14 MED ORDER — TECHNETIUM TC 99M TETROFOSMIN IV KIT: 10.0000 | PACK | Freq: Once | INTRAVENOUS | Status: DC | PRN

## 2019-04-14 MED ORDER — REGADENOSON 0.4 MG/5ML IV SOLN
INTRAVENOUS | Status: AC
  Administered 2019-04-14: 10:00:00 0.4 mg via INTRAVENOUS
  Filled 2019-04-14: qty 5

## 2019-04-14 NOTE — Progress Notes (Signed)
Progress Note  Patient Name: Jake Johnson Date of Encounter: 04/14/2019  Primary Cardiologist:   No primary care provider on file.   Subjective   No chest pain.  No SOB.    Inpatient Medications    Scheduled Meds: . amiodarone  200 mg Oral Daily  . doxycycline  100 mg Oral BID  . furosemide  40 mg Oral Daily  . latanoprost  1 drop Both Eyes QHS  . metoprolol succinate  25 mg Oral QHS  . metoprolol succinate  50 mg Oral Daily  . multivitamin with minerals  1 tablet Oral Daily  . regadenoson  0.4 mg Intravenous Once  . sodium chloride flush  3 mL Intravenous Q12H   Continuous Infusions: . 0.9 % NaCl with KCl 20 mEq / L 10 mL/hr at 04/13/19 1454   PRN Meds: acetaminophen **OR** acetaminophen, technetium tetrofosmin   Vital Signs    Vitals:   04/14/19 0031 04/14/19 0438 04/14/19 0447 04/14/19 0744  BP: 97/67  105/71 113/67  Pulse: 61  (!) 45 71  Resp: 16  17 18   Temp:   98.6 F (37 C) 98.9 F (37.2 C)  TempSrc:   Oral Oral  SpO2: 100%  98% 100%  Weight:  96.6 kg    Height:        Intake/Output Summary (Last 24 hours) at 04/14/2019 0836 Last data filed at 04/14/2019 0443 Gross per 24 hour  Intake 1230.75 ml  Output 2300 ml  Net -1069.25 ml   Filed Weights   04/13/19 0933 04/14/19 0438  Weight: 93 kg 96.6 kg    Telemetry    Atrial fib with controlled rate - Personally Reviewed  ECG    NA - Personally Reviewed  Physical Exam   GEN: No acute distress.   Neck: No  JVD Cardiac: Irregular RR, no murmurs, rubs, or gallops.  Respiratory: Clear  to auscultation bilaterally. GI: Soft, nontender, non-distended  MS: No  edema; No deformity. Neuro:  Nonfocal  Psych: Normal affect   Labs    Chemistry Recent Labs  Lab 04/09/19 1356  NA 136  K 4.5  CL 103  CO2 26  GLUCOSE 100*  BUN 24*  CREATININE 1.21  CALCIUM 9.9  GFRNONAA 57*  GFRAA >60  ANIONGAP 7     Hematology Recent Labs  Lab 04/09/19 1356  WBC 6.6  RBC 3.50*  HGB 9.1*  HCT  29.2*  MCV 83.4  MCH 26.0  MCHC 31.2  RDW 16.8*  PLT 188    Cardiac EnzymesNo results for input(s): TROPONINI in the last 168 hours. No results for input(s): TROPIPOC in the last 168 hours.   BNPNo results for input(s): BNP, PROBNP in the last 168 hours.   DDimer No results for input(s): DDIMER in the last 168 hours.   Radiology    No results found.  Cardiac Studies   Echo   1. The left ventricle has mild-moderately reduced systolic function, with an ejection fraction of 40-45%. The cavity size was normal. There is mildly increased left ventricular wall thickness. Left ventricular diastolic Doppler parameters are  indeterminate. Left ventricular diffuse hypokinesis. Technically difficult images, poor visualization of LV wall segments.  2. The right ventricle has mildly reduced systolic function. The cavity was mildly enlarged. There is no increase in right ventricular wall thickness.  3. Left atrial size was moderately dilated.  4. Right atrial size was mildly dilated.  5. Trivial pericardial effusion is present.  6. Mild calcification of the mitral  valve leaflet. No evidence of mitral valve stenosis. No significant regurgitation.  7. The aortic valve is tricuspid. Severe calcifcation of the aortic valve. No stenosis of the aortic valve.  8. The aorta is normal in size and structure.  9. The IVC was not visualized. No complete TR doppler jet so unable to estimate PA systolic pressure.   Patient Profile     78 y.o. male with a hx hypertension, sleep apnea previously on CPAP, AAA, kidney stone and recent diagnosis of atrial fibrillation and a reduced EF at [facility].  We were asked to see pre op before back surgery.  Assessment & Plan    ATRIAL FIB:   Rate controlled.  I am going to stop the amiodarone as the plan is rate management not rhythm management.  No plans for cardioversion.  Resume anticoag post up.     CARDIOMYOPATHY:  EF is mildly reduced as above.  Lexiscan Myoview  as above  PREOP:  I discussed the results of the above.  The patient has a low risk scan but with evidence of previous MI.  However, he has no symptoms.  Given this with the arrhythmia and mildly reduced EF he is at low/moderate risk for cardiovascular complications.  However, this is not prohibitive.  He understands this and is very willing to proceed given the potential benefit.  No further testing or change in therapy is indicated.   For questions or updates, please contact [facility] Please consult [email or web address] for contact info under Cardiology/STEMI.   Signed, [name], MD  04/14/2019, 8:36 AM

## 2019-04-14 NOTE — Progress Notes (Signed)
Pt will self administer CPAP  

## 2019-04-14 NOTE — Anesthesia Preprocedure Evaluation (Addendum)
Anesthesia Evaluation  Patient identified by MRN, date of birth, ID band Patient awake    Reviewed: Allergy & Precautions, NPO status , Patient's Chart, lab work & pertinent test results  History of Anesthesia Complications Negative for: history of anesthetic complications  Airway Mallampati: II  TM Distance: >3 FB Neck ROM: Full    Dental  (+) Dental Advisory Given   Pulmonary sleep apnea ,    Pulmonary exam normal        Cardiovascular hypertension, Pt. on medications and Pt. on home beta blockers Normal cardiovascular exam+ dysrhythmias Atrial Fibrillation    There was no ST segment deviation noted during stress.  No T wave inversion was noted during stress.  Defect 1: There is a large defect of severe severity present in the basal inferior, mid inferior, apical inferior and apex location.  Findings consistent with prior myocardial infarction with peri-infarct ischemia.  The left ventricular ejection fraction is mildly decreased (45-54%).  This is a low risk study.  Nuclear stress EF: 47%.   Abnormal low risk stress nuclear study with prior inferior infarct and very mild peri-infarct ischemia.  Gated ejection fraction 47% with hypokinesis of the inferior wall.   Neuro/Psych negative psych ROS   GI/Hepatic negative GI ROS, Neg liver ROS,   Endo/Other  negative endocrine ROS  Renal/GU Renal InsufficiencyRenal disease     Musculoskeletal   Abdominal   Peds  Hematology   Anesthesia Other Findings   Reproductive/Obstetrics                           Anesthesia Physical Anesthesia Plan  ASA: III  Anesthesia Plan: General   Post-op Pain Management:    Induction: Intravenous  PONV Risk Score and Plan: 2 and Ondansetron and Dexamethasone  Airway Management Planned: Oral ETT  Additional Equipment:   Intra-op Plan:   Post-operative Plan: Extubation in OR  Informed Consent: I  have reviewed the patients History and Physical, chart, labs and discussed the procedure including the risks, benefits and alternatives for the proposed anesthesia with the patient or authorized representative who has indicated his/her understanding and acceptance.     Dental advisory given  Plan Discussed with: CRNA and Anesthesiologist  Anesthesia Plan Comments:         Anesthesia Quick Evaluation

## 2019-04-14 NOTE — Progress Notes (Signed)
   Cassie Freer presented for a nuclear stress test today.  No immediate complications.  Stress imaging is pending at this time.  This was proctored by nuc med techs, so patient will still need round by cardiology team.  Preliminary EKG findings may be listed in the chart, but the stress test result will not be finalized until perfusion imaging is complete.  Talonda Artist Ninfa Meeker, PA-C  04/14/2019, 10:19 AM

## 2019-04-14 NOTE — Progress Notes (Signed)
Patient ID: Jake Johnson, male   DOB: 03/04/1941, 78 y.o.   MRN: [id number] Jake Johnson is for comfort only He's doing well, awaiting stress test Hopefully he'll be cleared for OR tomorrow, he's tentatively on the schedule

## 2019-04-15 ENCOUNTER — Inpatient Hospital Stay (HOSPITAL_COMMUNITY): Payer: Medicare Other | Admitting: Anesthesiology

## 2019-04-15 ENCOUNTER — Encounter (HOSPITAL_COMMUNITY)
Admission: RE | Disposition: A | Discharge status: Home Health | Payer: Self-pay | Source: Home / Self Care | Attending: Neurological Surgery

## 2019-04-15 ENCOUNTER — Encounter (HOSPITAL_COMMUNITY): Payer: Self-pay | Admitting: *Deleted

## 2019-04-15 ENCOUNTER — Inpatient Hospital Stay (HOSPITAL_COMMUNITY): Payer: Medicare Other

## 2019-04-15 DIAGNOSIS — Z981 Arthrodesis status: Secondary | ICD-10-CM

## 2019-04-15 HISTORY — PX: LAMINECTOMY WITH POSTERIOR LATERAL ARTHRODESIS LEVEL 4: SHX6338

## 2019-04-15 LAB — SURGICAL PCR SCREEN
MRSA, PCR: NEGATIVE
Staphylococcus aureus: NEGATIVE

## 2019-04-15 LAB — TYPE AND SCREEN
ABO/RH(D): AB POS
Antibody Screen: NEGATIVE

## 2019-04-15 LAB — ABO/RH: ABO/RH(D): AB POS

## 2019-04-15 SURGERY — LAMINECTOMY WITH POSTERIOR LATERAL ARTHRODESIS LEVEL 4
Anesthesia: General

## 2019-04-15 MED ORDER — LIDOCAINE 2% (20 MG/ML) 5 ML SYRINGE
INTRAMUSCULAR | Status: AC
  Filled 2019-04-15: qty 5

## 2019-04-15 MED ORDER — BUPIVACAINE HCL (PF) 0.25 % IJ SOLN
INTRAMUSCULAR | Status: AC
  Filled 2019-04-15: qty 30

## 2019-04-15 MED ORDER — SUCCINYLCHOLINE CHLORIDE 200 MG/10ML IV SOSY
PREFILLED_SYRINGE | INTRAVENOUS | Status: AC
  Filled 2019-04-15: qty 10

## 2019-04-15 MED ORDER — HEPARIN SODIUM (PORCINE) 1000 UNIT/ML IJ SOLN
INTRAMUSCULAR | Status: AC
  Filled 2019-04-15: qty 1

## 2019-04-15 MED ORDER — SODIUM CHLORIDE 0.9 % IV SOLN
INTRAVENOUS | Status: DC | PRN
  Administered 2019-04-15: 25 ug/min via INTRAVENOUS

## 2019-04-15 MED ORDER — ALBUMIN HUMAN 5 % IV SOLN
25.0000 g | Freq: Once | INTRAVENOUS | Status: AC
  Administered 2019-04-15: 25 g via INTRAVENOUS

## 2019-04-15 MED ORDER — HYDROMORPHONE HCL 1 MG/ML IJ SOLN
0.2500 mg | INTRAMUSCULAR | Status: DC | PRN
  Administered 2019-04-15: 0.25 mg via INTRAVENOUS
  Administered 2019-04-15: 0.5 mg via INTRAVENOUS

## 2019-04-15 MED ORDER — ARTHREX ANGEL - ACD-A SOLUTION (CHARTING ONLY) OPTIME
TOPICAL | Status: DC | PRN
  Administered 2019-04-15: 10 mL via TOPICAL

## 2019-04-15 MED ORDER — FENTANYL CITRATE (PF) 250 MCG/5ML IJ SOLN
INTRAMUSCULAR | Status: AC
  Filled 2019-04-15: qty 5

## 2019-04-15 MED ORDER — PHENOL 1.4 % MT LIQD: 1.0000 | OROMUCOSAL | Status: DC | PRN

## 2019-04-15 MED ORDER — CEFAZOLIN SODIUM-DEXTROSE 2-3 GM-%(50ML) IV SOLR
INTRAVENOUS | Status: DC | PRN
  Administered 2019-04-15: 2 g via INTRAVENOUS

## 2019-04-15 MED ORDER — PHENYLEPHRINE 40 MCG/ML (10ML) SYRINGE FOR IV PUSH (FOR BLOOD PRESSURE SUPPORT)
PREFILLED_SYRINGE | INTRAVENOUS | Status: DC | PRN
  Administered 2019-04-15: 80 ug via INTRAVENOUS
  Administered 2019-04-15: 120 ug via INTRAVENOUS

## 2019-04-15 MED ORDER — SODIUM CHLORIDE 0.9% FLUSH
3.0000 mL | INTRAVENOUS | Status: DC | PRN
  Administered 2019-04-17: 3 mL via INTRAVENOUS
  Filled 2019-04-15: qty 3

## 2019-04-15 MED ORDER — SUGAMMADEX SODIUM 200 MG/2ML IV SOLN
INTRAVENOUS | Status: DC | PRN
  Administered 2019-04-15: 150 mg via INTRAVENOUS

## 2019-04-15 MED ORDER — CEFAZOLIN SODIUM 1 G IJ SOLR
INTRAMUSCULAR | Status: AC
  Filled 2019-04-15: qty 20

## 2019-04-15 MED ORDER — SENNA 8.6 MG PO TABS
1.0000 | ORAL_TABLET | Freq: Two times a day (BID) | ORAL | Status: DC
  Administered 2019-04-15 – 2019-04-18 (×6): 8.6 mg via ORAL
  Filled 2019-04-15 (×6): qty 1

## 2019-04-15 MED ORDER — ARTIFICIAL TEARS OPHTHALMIC OINT
TOPICAL_OINTMENT | OPHTHALMIC | Status: AC
  Filled 2019-04-15: qty 3.5

## 2019-04-15 MED ORDER — LACTATED RINGERS IV SOLN
INTRAVENOUS | Status: DC | PRN
  Administered 2019-04-15 (×3): via INTRAVENOUS

## 2019-04-15 MED ORDER — ALBUMIN HUMAN 5 % IV SOLN
INTRAVENOUS | Status: DC | PRN
  Administered 2019-04-15: 13:00:00 via INTRAVENOUS

## 2019-04-15 MED ORDER — ONDANSETRON HCL 4 MG/2ML IJ SOLN
INTRAMUSCULAR | Status: DC | PRN
  Administered 2019-04-15: 4 mg via INTRAVENOUS

## 2019-04-15 MED ORDER — SODIUM CHLORIDE 0.9% FLUSH
3.0000 mL | Freq: Two times a day (BID) | INTRAVENOUS | Status: DC
  Administered 2019-04-15 – 2019-04-18 (×6): 3 mL via INTRAVENOUS

## 2019-04-15 MED ORDER — MIDAZOLAM HCL 2 MG/2ML IJ SOLN
INTRAMUSCULAR | Status: AC
  Filled 2019-04-15: qty 2

## 2019-04-15 MED ORDER — SODIUM CHLORIDE 0.9 % IV SOLN
INTRAVENOUS | Status: DC | PRN
  Administered 2019-04-15: 500 mL

## 2019-04-15 MED ORDER — ONDANSETRON HCL 4 MG PO TABS: 4.0000 mg | ORAL_TABLET | Freq: Four times a day (QID) | ORAL | Status: DC | PRN

## 2019-04-15 MED ORDER — 0.9 % SODIUM CHLORIDE (POUR BTL) OPTIME
TOPICAL | Status: DC | PRN
  Administered 2019-04-15: 10:00:00 1000 mL

## 2019-04-15 MED ORDER — BUPIVACAINE HCL (PF) 0.25 % IJ SOLN
INTRAMUSCULAR | Status: DC | PRN
  Administered 2019-04-15: 10 mL

## 2019-04-15 MED ORDER — OXYCODONE HCL 5 MG PO TABS
5.0000 mg | ORAL_TABLET | ORAL | Status: DC | PRN
  Administered 2019-04-15 – 2019-04-17 (×5): 5 mg via ORAL
  Filled 2019-04-15 (×5): qty 1

## 2019-04-15 MED ORDER — LIDOCAINE 2% (20 MG/ML) 5 ML SYRINGE
INTRAMUSCULAR | Status: DC | PRN
  Administered 2019-04-15: 60 mg via INTRAVENOUS

## 2019-04-15 MED ORDER — POTASSIUM CHLORIDE IN NACL 20-0.9 MEQ/L-% IV SOLN
INTRAVENOUS | Status: DC
  Administered 2019-04-15: 18:00:00 via INTRAVENOUS
  Filled 2019-04-15 (×2): qty 1000

## 2019-04-15 MED ORDER — FENTANYL CITRATE (PF) 250 MCG/5ML IJ SOLN
INTRAMUSCULAR | Status: DC | PRN
  Administered 2019-04-15: 50 ug via INTRAVENOUS
  Administered 2019-04-15: 25 ug via INTRAVENOUS
  Administered 2019-04-15 (×3): 50 ug via INTRAVENOUS
  Administered 2019-04-15: 25 ug via INTRAVENOUS
  Administered 2019-04-15 (×3): 50 ug via INTRAVENOUS
  Administered 2019-04-15: 25 ug via INTRAVENOUS

## 2019-04-15 MED ORDER — CELECOXIB 200 MG PO CAPS
200.0000 mg | ORAL_CAPSULE | Freq: Two times a day (BID) | ORAL | Status: DC
  Administered 2019-04-15 – 2019-04-18 (×6): 200 mg via ORAL
  Filled 2019-04-15 (×6): qty 1

## 2019-04-15 MED ORDER — PHENYLEPHRINE 40 MCG/ML (10ML) SYRINGE FOR IV PUSH (FOR BLOOD PRESSURE SUPPORT)
PREFILLED_SYRINGE | INTRAVENOUS | Status: AC
  Filled 2019-04-15: qty 10

## 2019-04-15 MED ORDER — METHOCARBAMOL 1000 MG/10ML IJ SOLN
500.0000 mg | Freq: Four times a day (QID) | INTRAVENOUS | Status: DC | PRN
  Filled 2019-04-15: qty 5

## 2019-04-15 MED ORDER — PROPOFOL 10 MG/ML IV BOLUS
INTRAVENOUS | Status: AC
  Filled 2019-04-15: qty 40

## 2019-04-15 MED ORDER — THROMBIN 5000 UNITS EX SOLR
OROMUCOSAL | Status: DC | PRN
  Administered 2019-04-15: 5 mL via TOPICAL

## 2019-04-15 MED ORDER — MENTHOL 3 MG MT LOZG: 1.0000 | LOZENGE | OROMUCOSAL | Status: DC | PRN

## 2019-04-15 MED ORDER — MORPHINE SULFATE (PF) 2 MG/ML IV SOLN: 2.0000 mg | INTRAVENOUS | Status: DC | PRN

## 2019-04-15 MED ORDER — DEXAMETHASONE SODIUM PHOSPHATE 10 MG/ML IJ SOLN
10.0000 mg | Freq: Once | INTRAMUSCULAR | Status: AC
  Administered 2019-04-15: 10 mg via INTRAVENOUS
  Filled 2019-04-15: qty 1

## 2019-04-15 MED ORDER — SUCCINYLCHOLINE CHLORIDE 200 MG/10ML IV SOSY
PREFILLED_SYRINGE | INTRAVENOUS | Status: DC | PRN
  Administered 2019-04-15: 120 mg via INTRAVENOUS

## 2019-04-15 MED ORDER — PROMETHAZINE HCL 25 MG/ML IJ SOLN: 6.2500 mg | INTRAMUSCULAR | Status: DC | PRN

## 2019-04-15 MED ORDER — ALBUMIN HUMAN 5 % IV SOLN
INTRAVENOUS | Status: AC
  Filled 2019-04-15: qty 250

## 2019-04-15 MED ORDER — ONDANSETRON HCL 4 MG/2ML IJ SOLN: 4.0000 mg | Freq: Four times a day (QID) | INTRAMUSCULAR | Status: DC | PRN

## 2019-04-15 MED ORDER — PROPOFOL 10 MG/ML IV BOLUS
INTRAVENOUS | Status: DC | PRN
  Administered 2019-04-15: 80 mg via INTRAVENOUS
  Administered 2019-04-15: 25 mg via INTRAVENOUS

## 2019-04-15 MED ORDER — ROCURONIUM BROMIDE 10 MG/ML (PF) SYRINGE
PREFILLED_SYRINGE | INTRAVENOUS | Status: AC
  Filled 2019-04-15: qty 10

## 2019-04-15 MED ORDER — SODIUM CHLORIDE 0.9 % IV SOLN: 250.0000 mL | INTRAVENOUS | Status: DC

## 2019-04-15 MED ORDER — ROCURONIUM BROMIDE 10 MG/ML (PF) SYRINGE
PREFILLED_SYRINGE | INTRAVENOUS | Status: DC | PRN
  Administered 2019-04-15: 20 mg via INTRAVENOUS
  Administered 2019-04-15: 40 mg via INTRAVENOUS

## 2019-04-15 MED ORDER — THROMBIN 20000 UNITS EX SOLR
CUTANEOUS | Status: AC
  Filled 2019-04-15: qty 20000

## 2019-04-15 MED ORDER — THROMBIN 20000 UNITS EX SOLR
CUTANEOUS | Status: DC | PRN
  Administered 2019-04-15: 20 mL via TOPICAL

## 2019-04-15 MED ORDER — MIDAZOLAM HCL 2 MG/2ML IJ SOLN
INTRAMUSCULAR | Status: DC | PRN
  Administered 2019-04-15 (×2): 1 mg via INTRAVENOUS

## 2019-04-15 MED ORDER — ACETAMINOPHEN 650 MG RE SUPP: 650.0000 mg | RECTAL | Status: DC | PRN

## 2019-04-15 MED ORDER — ACETAMINOPHEN 325 MG PO TABS
650.0000 mg | ORAL_TABLET | ORAL | Status: DC | PRN
  Administered 2019-04-15: 650 mg via ORAL
  Filled 2019-04-15: qty 2

## 2019-04-15 MED ORDER — CEFAZOLIN SODIUM-DEXTROSE 2-4 GM/100ML-% IV SOLN
2.0000 g | Freq: Three times a day (TID) | INTRAVENOUS | Status: AC
  Administered 2019-04-15 – 2019-04-16 (×2): 2 g via INTRAVENOUS
  Filled 2019-04-15 (×2): qty 100

## 2019-04-15 MED ORDER — HEPARIN SODIUM (PORCINE) 1000 UNIT/ML IJ SOLN
INTRAMUSCULAR | Status: DC | PRN
  Administered 2019-04-15: 5000 [IU]

## 2019-04-15 MED ORDER — HYDROMORPHONE HCL 1 MG/ML IJ SOLN: 0.2500 mg | INTRAMUSCULAR | Status: DC | PRN

## 2019-04-15 MED ORDER — METHOCARBAMOL 500 MG PO TABS
500.0000 mg | ORAL_TABLET | Freq: Four times a day (QID) | ORAL | Status: DC | PRN
  Administered 2019-04-15 – 2019-04-17 (×3): 500 mg via ORAL
  Filled 2019-04-15 (×3): qty 1

## 2019-04-15 MED ORDER — THROMBIN 5000 UNITS EX SOLR
CUTANEOUS | Status: AC
  Filled 2019-04-15: qty 5000

## 2019-04-15 MED ORDER — HYDROMORPHONE HCL 1 MG/ML IJ SOLN
INTRAMUSCULAR | Status: AC
  Filled 2019-04-15: qty 1

## 2019-04-15 SURGICAL SUPPLY — 72 items
BAG DECANTER FOR FLEXI CONT (MISCELLANEOUS) ×3 IMPLANT
BASKET BONE COLLECTION (BASKET) ×3 IMPLANT
BENZOIN TINCTURE PRP APPL 2/3 (GAUZE/BANDAGES/DRESSINGS) ×3 IMPLANT
BLADE CLIPPER SURG (BLADE) ×3 IMPLANT
BUR MATCHSTICK NEURO 3.0 LAGG (BURR) ×3 IMPLANT
CANISTER SUCT 3000ML PPV (MISCELLANEOUS) ×3 IMPLANT
CARTRIDGE OIL MAESTRO DRILL (MISCELLANEOUS) ×1 IMPLANT
CLOSURE WOUND 1/2 X4 (GAUZE/BANDAGES/DRESSINGS) ×1
CONT SPEC 4OZ CLIKSEAL STRL BL (MISCELLANEOUS) ×3 IMPLANT
COVER BACK TABLE 60X90IN (DRAPES) ×3 IMPLANT
COVER WAND RF STERILE (DRAPES) ×3 IMPLANT
DERMABOND ADVANCED (GAUZE/BANDAGES/DRESSINGS) ×2
DERMABOND ADVANCED .7 DNX12 (GAUZE/BANDAGES/DRESSINGS) ×1 IMPLANT
DIFFUSER DRILL AIR PNEUMATIC (MISCELLANEOUS) ×3 IMPLANT
DRAPE C-ARM 42X72 X-RAY (DRAPES) ×3 IMPLANT
DRAPE C-ARMOR (DRAPES) ×3 IMPLANT
DRAPE LAPAROTOMY 100X72X124 (DRAPES) ×3 IMPLANT
DRAPE POUCH INSTRU U-SHP 10X18 (DRAPES) ×3 IMPLANT
DRAPE SURG 17X23 STRL (DRAPES) ×3 IMPLANT
DRSG OPSITE POSTOP 4X10 (GAUZE/BANDAGES/DRESSINGS) ×3 IMPLANT
DRSG OPSITE POSTOP 4X6 (GAUZE/BANDAGES/DRESSINGS) ×3 IMPLANT
DURAPREP 26ML APPLICATOR (WOUND CARE) ×3 IMPLANT
ELECT REM PT RETURN 9FT ADLT (ELECTROSURGICAL) ×3
ELECTRODE REM PT RTRN 9FT ADLT (ELECTROSURGICAL) ×1 IMPLANT
EVACUATOR 1/8 PVC DRAIN (DRAIN) ×3 IMPLANT
GAUZE 4X4 16PLY RFD (DISPOSABLE) ×3 IMPLANT
GLOVE BIO SURGEON STRL SZ7 (GLOVE) ×3 IMPLANT
GLOVE BIO SURGEON STRL SZ8 (GLOVE) ×6 IMPLANT
GLOVE BIOGEL PI IND STRL 6.5 (GLOVE) ×1 IMPLANT
GLOVE BIOGEL PI IND STRL 7.0 (GLOVE) ×1 IMPLANT
GLOVE BIOGEL PI IND STRL 7.5 (GLOVE) ×2 IMPLANT
GLOVE BIOGEL PI INDICATOR 6.5 (GLOVE) ×2
GLOVE BIOGEL PI INDICATOR 7.0 (GLOVE) ×2
GLOVE BIOGEL PI INDICATOR 7.5 (GLOVE) ×4
GLOVE SURG SS PI 6.0 STRL IVOR (GLOVE) ×9 IMPLANT
GOWN STRL REUS W/ TWL LRG LVL3 (GOWN DISPOSABLE) ×2 IMPLANT
GOWN STRL REUS W/ TWL XL LVL3 (GOWN DISPOSABLE) ×2 IMPLANT
GOWN STRL REUS W/TWL 2XL LVL3 (GOWN DISPOSABLE) IMPLANT
GOWN STRL REUS W/TWL LRG LVL3 (GOWN DISPOSABLE) ×4
GOWN STRL REUS W/TWL XL LVL3 (GOWN DISPOSABLE) ×4
HEMOSTAT POWDER KIT SURGIFOAM (HEMOSTASIS) ×3 IMPLANT
KIT BASIN OR (CUSTOM PROCEDURE TRAY) ×3 IMPLANT
KIT BONE MARROW PROCESS ANGEL (KITS) IMPLANT
KIT BONE MRW ASP ANGEL CPRP (KITS) ×3 IMPLANT
KIT TURNOVER KIT B (KITS) ×3 IMPLANT
MATRIX STRIP NEOCORE 12C (Putty) ×2 IMPLANT
MILL MEDIUM DISP (BLADE) IMPLANT
NEEDLE HYPO 25X1 1.5 SAFETY (NEEDLE) ×3 IMPLANT
NS IRRIG 1000ML POUR BTL (IV SOLUTION) ×3 IMPLANT
OIL CARTRIDGE MAESTRO DRILL (MISCELLANEOUS) ×3
PACK LAMINECTOMY NEURO (CUSTOM PROCEDURE TRAY) ×3 IMPLANT
PUTTY DBM 10CC ×3 IMPLANT
PUTTY DBM ALLOSYNC PURE 10CC (Putty) ×3 IMPLANT
ROD TI STRAIGHT 5.5X500 (Rod) ×3 IMPLANT
SCREW KODIAK 6.5X45 (Screw) ×6 IMPLANT
SCREW KODIAK 6.5X50MM (Screw) ×30 IMPLANT
SCREW PA INVICTUS 6.5X35 (Screw) ×6 IMPLANT
SET SCREW (Screw) ×28 IMPLANT
SET SCREW SPNE (Screw) ×14 IMPLANT
SPONGE LAP 4X18 RFD (DISPOSABLE) IMPLANT
SPONGE SURGIFOAM ABS GEL 100 (HEMOSTASIS) ×3 IMPLANT
STRIP CLOSURE SKIN 1/2X4 (GAUZE/BANDAGES/DRESSINGS) ×2 IMPLANT
STRIP MATRIX NEOCORE 12CC (Putty) ×4 IMPLANT
SUT VIC AB 0 CT1 18XCR BRD8 (SUTURE) ×2 IMPLANT
SUT VIC AB 0 CT1 8-18 (SUTURE) ×4
SUT VIC AB 2-0 CP2 18 (SUTURE) ×9 IMPLANT
SUT VIC AB 3-0 SH 8-18 (SUTURE) ×15 IMPLANT
SYR CONTROL 10ML LL (SYRINGE) ×6 IMPLANT
TOWEL GREEN STERILE (TOWEL DISPOSABLE) ×3 IMPLANT
TOWEL GREEN STERILE FF (TOWEL DISPOSABLE) ×3 IMPLANT
TRAY FOLEY MTR SLVR 16FR STAT (SET/KITS/TRAYS/PACK) ×3 IMPLANT
WATER STERILE IRR 1000ML POUR (IV SOLUTION) ×3 IMPLANT

## 2019-04-15 NOTE — Anesthesia Postprocedure Evaluation (Signed)
Anesthesia Post Note  Patient: Jake Johnson  Procedure(s) Performed: Posterior Lateral Fusion with segmental instrumentation Thoracic ten-Lumbar four (N/A )     Patient location during evaluation: PACU Anesthesia Type: General Level of consciousness: sedated Pain management: pain level controlled Vital Signs Assessment: post-procedure vital signs reviewed and stable Respiratory status: spontaneous breathing and respiratory function stable Cardiovascular status: stable Postop Assessment: no apparent nausea or vomiting Anesthetic complications: no    Last Vitals:  Vitals:   04/15/19 1515 04/15/19 1530  BP: (!) 92/53 (!) 98/55  Pulse: (!) 59 65  Resp: 20 12  Temp:  (!) 36.1 C  SpO2: 100% 96%    Last Pain:  Vitals:   04/15/19 1520  TempSrc:   PainSc: Asleep                 Cynthie Garmon DANIEL

## 2019-04-15 NOTE — Progress Notes (Signed)
Pt informed of surgery scheduled time and possibility of not returning to this unit. verbalized understanding.

## 2019-04-15 NOTE — Progress Notes (Signed)
    The patient was seen in PACU.  Doing OK. No acute cardiovascular complaints.  We will continue to follow.

## 2019-04-15 NOTE — Op Note (Signed)
04/15/2019  2:43 PM  PATIENT:  Jake Johnson  78 y.o. 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  Posterior thoracolumbar fusion T9-L4 doing morselized allograft soaked with a bone marrow aspirate obtained through a separate fascial incision over the right iliac crest, 2.  Segmental fixation T10-L4 inclusive utilizing Alphatec pedicle screws  SURGEON:  Sherley Bounds, MD  ASSISTANTS: Glenford Peers, FNP  ANESTHESIA:   General  EBL: 300 ml  Total I/O In: 2250 [I.V.:2000; IV Piggyback:250] Out: 8937 [Urine:1025; Blood:300]  BLOOD ADMINISTERED: none  DRAINS: Medium Hemovac  SPECIMEN:  none  INDICATION FOR PROCEDURE: This patient presented with severe back pain. Imaging showed destruction of the L1-2 vertebral bodies with the sequelae of discitis that had been treated with IV antibiotics.. The patient tried conservative measures without relief. Pain was debilitating. Recommended thoraco lumbar fusion. Patient understood the risks, benefits, and alternatives and potential outcomes and wished to proceed.  PROCEDURE DETAILS: The patient was taken to the operating room and after induction of adequate generalized endotracheal anesthesia, the patient was rolled into the prone position on the chest rolls and all pressure points were padded. The thoracic and lumbar region was cleaned and then prepped with DuraPrep and draped in the usual sterile fashion. 10 cc of local anesthesia was injected and then a dorsal midline incision was made and carried down to the thoracic and lumbar fascia. The fascia was opened and the paraspinous musculature was taken down in a subperiosteal fashion to expose T9-L4 bilaterally.  We dissected in a suprafascial plane to expose the right iliac crest.  We extracted 60 cc of bone marrow aspirate which was spun down to B MAC in the  [place] device.  This was then used for later arthrodesis.  We dried the hole with Surgifoam.  Intraoperative fluoroscopy confirmed my level, and I started with placement of the pedicle screws.  We localize the pedicle screw entry zones utilizing AP and lateral fluoroscopy and surface landmarks.  In the lumbar region we placed screws and a cortical trajectory starting at 5 and 7:00 on the pedicle face and then drilling in an upward and outward direction with a hand drill.  We tapped line to line with a 6.5 mm tap and then placed 6.5 x 50 mm pedicle screws at L3, L4 and L2.  With a shorter 6.5 x 35 mm pedicle screws at L1.  In the thoracic spine we localized the pedicle screw entry zones utilizing surface landmarks and AP and lateral fluoroscopy.  We used the pedicle probe to probe each pedicle and then tapped each pedicle with a 5 5 tap and then placed 6.5 x 50 mm pedicle screws into T10-T11 and T12.  We checked our placement with AP and lateral fluoroscopy.  We then used 125 mm rods and shape these into a gentle kyphotic curvature, placed these into the pedicle screw tulip heads and locked these into position with locking caps and the antitorque device.  We then checked our final construct with AP and lateral fluoroscopy.  I irrigated with saline solution containing bacitracin.  We decorticated the lamina of T9-L4 as well as the transverse processes of T12-L2.  We placed a mixture of morselized allograft soaked with a bone marrow aspirate and the PPP and B MAC to achieve arthrodesis T9-L4.  We placed a medium Hemovac drain through a separate stab incision and then  closed the fascia with 0 Vicryl. I closed the subcutaneous tissues with 2-0 Vicryl and the subcuticular tissues with 3-0 Vicryl. The skin was then closed with Dermabond, benzoin and Steri-Strips. The drapes were removed, a sterile dressing was applied. The patient was awakened from general anesthesia and transferred to the recovery room in stable condition.  At the end of the procedure all sponge, needle and instrument counts were correct.    PLAN OF CARE: Admit to inpatient   PATIENT DISPOSITION:  PACU - hemodynamically stable.   Delay start of Pharmacological VTE agent (>24hrs) due to surgical blood loss or risk of bleeding:  yes

## 2019-04-15 NOTE — Progress Notes (Signed)
Patient has home CPAP within reach at bedside.  RT checked water chamber.  CPAP clean no frayed wires.

## 2019-04-15 NOTE — Progress Notes (Signed)
Patient ID: Jake Johnson, male   DOB: [birth year], 78 y.o.   MRN: [id number] I appreciate the cardiology work-up prior to surgery.  It appears the patient is low to moderate risk.  The patient accepts this risk and is willing to move forward with a T10-L4 instrumented fusion across his L1-2 disc space which had discitis which has caused instability with kyphosis and destruction of most of the L1 vertebral body.  I have discussed this with him again today.  He understands the risks and potential benefits of the surgery and wishes to proceed as previously planned.

## 2019-04-15 NOTE — Transfer of Care (Signed)
Immediate Anesthesia Transfer of Care Note  Patient: Jake Johnson  Procedure(s) Performed: Posterior Lateral Fusion with segmental instrumentation Thoracic ten-Lumbar four (N/A )  Patient Location: PACU  Anesthesia Type:General  Level of Consciousness: drowsy, patient cooperative and responds to stimulation  Airway & Oxygen Therapy: Patient Spontanous Breathing  Post-op Assessment: Report given to RN and Post -op Vital signs reviewed and stable  Post vital signs: Reviewed and stable  Last Vitals:  Vitals Value Taken Time  BP 99/71 04/15/19 1430  Temp    Pulse 69 04/15/19 1433  Resp 14 04/15/19 1433  SpO2 94 % 04/15/19 1433  Vitals shown include unvalidated device data.  Last Pain:  Vitals:   04/15/19 0737  TempSrc:   PainSc: 0-No pain         Complications: No apparent anesthesia complications

## 2019-04-16 ENCOUNTER — Encounter (HOSPITAL_COMMUNITY): Payer: Self-pay | Admitting: Neurological Surgery

## 2019-04-16 MED ORDER — FUROSEMIDE 20 MG PO TABS
20.0000 mg | ORAL_TABLET | Freq: Every day | ORAL | Status: DC
  Administered 2019-04-17 – 2019-04-18 (×2): 20 mg via ORAL
  Filled 2019-04-16 (×2): qty 1

## 2019-04-16 MED ORDER — ASPIRIN 325 MG PO TABS
325.0000 mg | ORAL_TABLET | Freq: Every day | ORAL | Status: DC
  Administered 2019-04-16 – 2019-04-18 (×3): 325 mg via ORAL
  Filled 2019-04-16 (×3): qty 1

## 2019-04-16 MED FILL — Heparin Sodium (Porcine) Inj 1000 Unit/ML: INTRAMUSCULAR | Qty: 30 | Status: AC

## 2019-04-16 MED FILL — Sodium Chloride IV Soln 0.9%: INTRAVENOUS | Qty: 1000 | Status: AC

## 2019-04-16 NOTE — Progress Notes (Signed)
Patients home CPAP at bedside within reach. Water level filled to max level. Patient informed to call for help if needed. Patient sitting up in chair.

## 2019-04-16 NOTE — Evaluation (Signed)
Physical Therapy Evaluation Patient Details Name: Jake Johnson MRN: [id number] DOB: [birth year] Today's Date: 04/16/2019   History of Present Illness  Pt is a 78 y/o male s/p posterior fusion at T9-L4. PMH including but not limited to HTN and AAA.  Clinical Impression  Pt presented supine in bed with HOB elevated, awake and willing to participate in therapy session. Prior to admission, pt reported that he ambulated with use of a cane and required some assistance from his wife with ADLs. Pt lives with his wife in a two level house (bed/bath on main level) with a level entry. At the time of evaluation, pt overall min guard level for all mobility with use of RW to ambulate in hallway. PT reviewed 3/3 back precautions with pt throughout. Pt would continue to benefit from skilled physical therapy services at this time while admitted and after d/c to address the below listed limitations in order to improve overall safety and independence with functional mobility.     Follow Up Recommendations Home health PT;Supervision for mobility/OOB    Equipment Recommendations  3in1 (PT)    Recommendations for Other Services       Precautions / Restrictions Precautions Precautions: Fall;Back Precaution Comments: PT reviewed 3/3 back precautions and log roll technique with pt Required Braces or Orthoses: Spinal Brace Spinal Brace: Thoracolumbosacral orthotic Restrictions Weight Bearing Restrictions: No      Mobility  Bed Mobility Overal bed mobility: Needs Assistance Bed Mobility: Rolling;Sidelying to Sit Rolling: Min guard Sidelying to sit: Min guard       General bed mobility comments: min guard for safety, cueing for log roll technique, use of bed rail  Transfers Overall transfer level: Needs assistance Equipment used: Rolling walker (2 wheeled) Transfers: Sit to/from Stand Sit to Stand: From elevated surface;Min guard         General transfer comment: cueing for safe hand placement,  min guard for safety with transitional movement  Ambulation/Gait Ambulation/Gait assistance: Min guard Gait Distance (Feet): 100 Feet Assistive device: Rolling walker (2 wheeled) Gait Pattern/deviations: Step-through pattern Gait velocity: decreased   General Gait Details: pt with slow, steady gait with use of RW; occasional cueing to maintain proximity to RW; no LOB or need for physical assistance  Stairs            Wheelchair Mobility    Modified Rankin (Stroke Patients Only)       Balance Overall balance assessment: Needs assistance Sitting-balance support: Feet supported Sitting balance-Leahy Scale: Good     Standing balance support: Bilateral upper extremity supported Standing balance-Leahy Scale: Poor                               Pertinent Vitals/Pain Pain Assessment: 0-10 Pain Score: 5  Pain Location: back Pain Descriptors / Indicators: Sore Pain Intervention(s): Monitored during session;Repositioned    Home Living Family/patient expects to be discharged to:: Private residence Living Arrangements: Spouse/significant other Available Help at Discharge: Family;Neighbor;Available 24 hours/day Type of Home: House Home Access: Level entry     Home Layout: Two level;Able to live on main level with bedroom/bathroom Home Equipment: Kasandra Knudsen - single point;Shower seat;Walker - 2 wheels;Grab bars - tub/shower;Grab bars - toilet(has bed rail)      Prior Function Level of Independence: Needs assistance   Gait / Transfers Assistance Needed: ambulates with cane  ADL's / Homemaking Assistance Needed: wifes assists with bathing/dressing tasks  Comments: ambulates with a cane  Hand Dominance        Extremity/Trunk Assessment   Upper Extremity Assessment Upper Extremity Assessment: Overall WFL for tasks assessed;Defer to OT evaluation    Lower Extremity Assessment Lower Extremity Assessment: Overall WFL for tasks assessed    Cervical /  Trunk Assessment Cervical / Trunk Assessment: Other exceptions Cervical / Trunk Exceptions: s/p thoracolumbar sx  Communication   Communication: HOH  Cognition Arousal/Alertness: Awake/alert Behavior During Therapy: WFL for tasks assessed/performed Overall Cognitive Status: Impaired/Different from baseline Area of Impairment: Problem solving                             Problem Solving: Difficulty sequencing;Requires verbal cues        General Comments      Exercises     Assessment/Plan    PT Assessment Patient needs continued PT services  PT Problem List Decreased balance;Decreased mobility;Decreased coordination;Decreased knowledge of use of DME;Decreased safety awareness;Decreased knowledge of precautions;Pain       PT Treatment Interventions DME instruction;Gait training;Stair training;Functional mobility training;Therapeutic activities;Therapeutic exercise;Balance training;Neuromuscular re-education;Patient/family education    PT Goals (Current goals can be found in the Care Plan section)  Acute Rehab PT Goals Patient Stated Goal: decrease pain PT Goal Formulation: With patient Time For Goal Achievement: 04/30/19 Potential to Achieve Goals: Good    Frequency Min 5X/week   Barriers to discharge        Co-evaluation PT/OT/SLP Co-Evaluation/Treatment: Yes Reason for Co-Treatment: To address functional/ADL transfers;For patient/therapist safety PT goals addressed during session: Mobility/safety with mobility;Balance;Strengthening/ROM;Proper use of DME         AM-PAC PT "6 Clicks" Mobility  Outcome Measure Help needed turning from your back to your side while in a flat bed without using bedrails?: A Little Help needed moving from lying on your back to sitting on the side of a flat bed without using bedrails?: A Little Help needed moving to and from a bed to a chair (including a wheelchair)?: A Little Help needed standing up from a chair using your  arms (e.g., wheelchair or bedside chair)?: None Help needed to walk in hospital room?: None Help needed climbing 3-5 steps with a railing? : A Little 6 Click Score: 20    End of Session Equipment Utilized During Treatment: Gait belt;Back brace Activity Tolerance: Patient tolerated treatment well Patient left: in chair;with call bell/phone within reach;with chair alarm set Nurse Communication: Mobility status PT Visit Diagnosis: Other abnormalities of gait and mobility (R26.89);Pain Pain - part of body: (back)    Time: 9163-8466 PT Time Calculation (min) (ACUTE ONLY): 31 min   Charges:   PT Evaluation $PT Eval Moderate Complexity: 1 Mod          Deborah Chalk, PT, DPT  Acute Rehabilitation Services Pager [phone] Office [phone]    Alessandra Bevels Noam Franzen 04/16/2019, 10:36 AM

## 2019-04-16 NOTE — Care Management Important Message (Signed)
Important Message  Patient Details  Name: Colbin Jovel MRN: [id number] Date of Birth: 09/21/1940   Medicare Important Message Given:  Yes     Shelda Altes 04/16/2019, 1:42 PM

## 2019-04-16 NOTE — Progress Notes (Signed)
Progress Note  Patient Name: Jake DuffWilliam Johnson Date of Encounter: 04/16/2019  Primary Cardiologist:   No primary care provider on file.   Subjective   Status post lumbar and thoracic back surgery.  No cardiac complaints.  No chest pain or SOB.    Inpatient Medications    Scheduled Meds: . celecoxib  200 mg Oral Q12H  . doxycycline  100 mg Oral BID  . furosemide  40 mg Oral Daily  . latanoprost  1 drop Both Eyes QHS  . metoprolol succinate  25 mg Oral QHS  . metoprolol succinate  50 mg Oral Daily  . multivitamin with minerals  1 tablet Oral Daily  . senna  1 tablet Oral BID  . sodium chloride flush  3 mL Intravenous Q12H   Continuous Infusions: . sodium chloride    . 0.9 % NaCl with KCl 20 mEq / L 75 mL/hr at 04/15/19 1749  . methocarbamol (ROBAXIN) IV     PRN Meds: acetaminophen **OR** acetaminophen, menthol-cetylpyridinium **OR** phenol, methocarbamol **OR** methocarbamol (ROBAXIN) IV, morphine injection, ondansetron **OR** ondansetron (ZOFRAN) IV, oxyCODONE, sodium chloride flush   Vital Signs    Vitals:   04/15/19 2027 04/15/19 2134 04/16/19 0051 04/16/19 0507  BP: 98/66 93/61 (!) 89/60 96/65  Pulse: 79 61 65 77  Resp: 18  12 15   Temp: (!) 97.3 F (36.3 C)  98.3 F (36.8 C) 98 F (36.7 C)  TempSrc: Axillary  Oral Oral  SpO2: 98%   98%  Weight:      Height:        Intake/Output Summary (Last 24 hours) at 04/16/2019 0724 Last data filed at 04/16/2019 0400 Gross per 24 hour  Intake 3426.38 ml  Output 2695 ml  Net 731.38 ml   Filed Weights   04/13/19 0933 04/14/19 0438 04/15/19 0923  Weight: 93 kg 96.6 kg 96.6 kg    Telemetry    Atrial fib with controlled rate - Personally Reviewed  ECG    NA - Personally Reviewed  Physical Exam   GEN: No  acute distress.   Neck: No  JVD Cardiac: Irregular RR, no murmurs, rubs, or gallops.  Respiratory: Clear  to auscultation bilaterally. GI: Soft, nontender, non-distended, normal bowel sounds  MS:  No edema; No  deformity. Neuro:   Nonfocal  Psych: Oriented and appropriate    Labs    Chemistry Recent Labs  Lab 04/09/19 1356  NA 136  K 4.5  CL 103  CO2 26  GLUCOSE 100*  BUN 24*  CREATININE 1.21  CALCIUM 9.9  GFRNONAA 57*  GFRAA >60  ANIONGAP 7     Hematology Recent Labs  Lab 04/09/19 1356  WBC 6.6  RBC 3.50*  HGB 9.1*  HCT 29.2*  MCV 83.4  MCH 26.0  MCHC 31.2  RDW 16.8*  PLT 188    Cardiac EnzymesNo results for input(s): TROPONINI in the last 168 hours. No results for input(s): TROPIPOC in the last 168 hours.   BNPNo results for input(s): BNP, PROBNP in the last 168 hours.   DDimer No results for input(s): DDIMER in the last 168 hours.   Radiology    Dg Lumbar Spine 2-3 Views  Result Date: 04/15/2019 CLINICAL DATA:  T10-L4 instrumentation EXAM: LUMBAR SPINE - 2-3 VIEW; DG C-ARM 61-120 MIN COMPARISON:  CT thoracic and lumbar spine 03/18/2019 FLUOROSCOPY TIME:  2 minutes 39 seconds Images obtained: 6 FINDINGS: Osseous demineralization. Prior CT demonstrates marked compression deformity of the L1 vertebral body. BILATERAL pedicle screws  and posterior bars are identified from T10 to L4. Marked compression fracture of L1 vertebral body again seen. Remaining vertebral bodies appear normal in alignment. Subtle superior endplate deformity of H06 with height loss is noted. IMPRESSION: Posterior fusion T10-L4. Marked compression fracture of L1 vertebral body. Electronically Signed   By: Lavonia Dana M.D.   On: 04/15/2019 13:59   Nm Myocar Multi W/spect W/wall Motion / Ef  Result Date: 04/14/2019  There was no ST segment deviation noted during stress.  No T wave inversion was noted during stress.  Defect 1: There is a large defect of severe severity present in the basal inferior, mid inferior, apical inferior and apex location.  Findings consistent with prior myocardial infarction with peri-infarct ischemia.  The left ventricular ejection fraction is mildly decreased (45-54%).   This is a low risk study.  Nuclear stress EF: 47%.  Abnormal low risk stress nuclear study with prior inferior infarct and very mild peri-infarct ischemia.  Gated ejection fraction 47% with hypokinesis of the inferior wall.   Dg C-arm 1-60 Min  Result Date: 04/15/2019 CLINICAL DATA:  T10-L4 instrumentation EXAM: LUMBAR SPINE - 2-3 VIEW; DG C-ARM 61-120 MIN COMPARISON:  CT thoracic and lumbar spine 03/18/2019 FLUOROSCOPY TIME:  2 minutes 39 seconds Images obtained: 6 FINDINGS: Osseous demineralization. Prior CT demonstrates marked compression deformity of the L1 vertebral body. BILATERAL pedicle screws and posterior bars are identified from T10 to L4. Marked compression fracture of L1 vertebral body again seen. Remaining vertebral bodies appear normal in alignment. Subtle superior endplate deformity of C37 with height loss is noted. IMPRESSION: Posterior fusion T10-L4. Marked compression fracture of L1 vertebral body. Electronically Signed   By: Lavonia Dana M.D.   On: 04/15/2019 13:59    Cardiac Studies   Echo   1. The left ventricle has mild-moderately reduced systolic function, with an ejection fraction of 40-45%. The cavity size was normal. There is mildly increased left ventricular wall thickness. Left ventricular diastolic Doppler parameters are  indeterminate. Left ventricular diffuse hypokinesis. Technically difficult images, poor visualization of LV wall segments.  2. The right ventricle has mildly reduced systolic function. The cavity was mildly enlarged. There is no increase in right ventricular wall thickness.  3. Left atrial size was moderately dilated.  4. Right atrial size was mildly dilated.  5. Trivial pericardial effusion is present.  6. Mild calcification of the mitral valve leaflet. No evidence of mitral valve stenosis. No significant regurgitation.  7. The aortic valve is tricuspid. Severe calcifcation of the aortic valve. No stenosis of the aortic valve.  8. The aorta is normal  in size and structure.  9. The IVC was not visualized. No complete TR doppler jet so unable to estimate PA systolic pressure.   Patient Profile     78 y.o. male with a hx hypertension, sleep apnea previously on CPAP, AAA, kidney stone and recent diagnosis of atrial fibrillation and a reduced EF at [facility].  We were asked to see pre op before back surgery.  Assessment & Plan    ATRIAL FIB:   Rate controlled.   Amiodarone stopped.  OK to stop tele.  Stop ASA and start Xarelto back when OK with neurosurgery.    CARDIOMYOPATHY:  EF is mildly reduced as above.  Lexiscan Myoview as above.  Continue medical management.  Unable to titrate meds with low BP.  I will reduce the Lasix and this could be held if he continues to be hypotensive.  Seems to be euvolemic.  Will continue beta blocker as BP allows.    For questions or updates, please contact [facility] Please consult [email or web address] for contact info under Cardiology/STEMI.   Signed, [name], MD  04/16/2019, 7:24 AM

## 2019-04-16 NOTE — Evaluation (Signed)
Occupational Therapy Evaluation Patient Details Name: Jake Johnson MRN: [id number] DOB: 1941-07-05 Today's Date: 04/16/2019    History of Present Illness Pt is a 78 y/o male s/p posterior fusion at T9-L4. PMH including but not limited to HTN and AAA.   Clinical Impression   This 78 yo male admitted and underwent above presents to acute OT with back precautions, decreased balance, decreased mobility all affecting his safety and independence with basic ADLs. He will benefit from acute OT with follow up [place].    Follow Up Recommendations  Home health OT;Supervision/Assistance - 24 hour    Equipment Recommendations  None recommended by OT       Precautions / Restrictions Precautions Precautions: Fall;Back Precaution Comments: PT reviewed 3/3 back precautions and log roll technique with pt Required Braces or Orthoses: Spinal Brace Spinal Brace: Thoracolumbosacral orthotic;Applied in sitting position Restrictions Weight Bearing Restrictions: No      Mobility Bed Mobility Overal bed mobility: Needs Assistance Bed Mobility: Rolling;Sidelying to Sit Rolling: Min guard Sidelying to sit: Min guard       General bed mobility comments: min guard for safety, cueing for log roll technique, use of bed rail  Transfers Overall transfer level: Needs assistance Equipment used: Rolling walker (2 wheeled) Transfers: Sit to/from Stand Sit to Stand: From elevated surface;Min guard         General transfer comment: cueing for safe hand placement, min guard for safety with transitional movement    Balance Overall balance assessment: Needs assistance Sitting-balance support: Feet supported Sitting balance-Leahy Scale: Good     Standing balance support: Bilateral upper extremity supported;No upper extremity supported Standing balance-Leahy Scale: Fair Standing balance comment: standing to brush teeth                           ADL either performed or assessed with clinical  judgement   ADL Overall ADL's : Needs assistance/impaired Eating/Feeding: Independent;Sitting   Grooming: Min guard;Standing   Upper Body Bathing: Set up;Sitting   Lower Body Bathing: Moderate assistance Lower Body Bathing Details (indicate cue type and reason): min guard sit<>stand Upper Body Dressing : Set up;Sitting   Lower Body Dressing: Maximal assistance Lower Body Dressing Details (indicate cue type and reason): min guard sit<>stand Toilet Transfer: Minimal assistance;Ambulation;BSC;RW Toilet Transfer Details (indicate cue type and reason): over toilet Toileting- Clothing Manipulation and Hygiene: Moderate assistance Toileting - Clothing Manipulation Details (indicate cue type and reason): min guard sit<>stand             Vision Patient Visual Report: No change from baseline              Pertinent Vitals/Pain Pain Assessment: 0-10 Pain Score: 5  Pain Location: back Pain Descriptors / Indicators: Sore Pain Intervention(s): Monitored during session;Repositioned     Hand Dominance Right   Extremity/Trunk Assessment Upper Extremity Assessment Upper Extremity Assessment: Overall WFL for tasks assessed           Communication Communication Communication: HOH   Cognition Arousal/Alertness: Awake/alert Behavior During Therapy: WFL for tasks assessed/performed Overall Cognitive Status: Impaired/Different from baseline Area of Impairment: Problem solving                             Problem Solving: Difficulty sequencing;Requires verbal cues                Home Living Family/patient expects to be discharged to:: Private residence  Available Help at Discharge: Family;Neighbor;Available 24 hours/day Type of Home: House Home Access: Level entry     Home Layout: Two level;Able to live on main level with bedroom/bathroom Alternate Level Stairs-Number of Steps: 16 Alternate Level Stairs-Rails: Right;Left Bathroom Shower/Tub: Walk-in  shower         Home Equipment: Gilmer Mor - single point;Shower seat;Walker - 2 wheels;Grab bars - tub/shower;Grab bars - toilet          Prior Functioning/Environment Level of Independence: Needs assistance  Gait / Transfers Assistance Needed: ambulates with cane ADL's / Homemaking Assistance Needed: wifes assists with bathing/dressing tasks   Comments: ambulates with a cane        OT Problem List: Decreased strength;Decreased range of motion;Impaired balance (sitting and/or standing);Decreased knowledge of precautions;Pain      OT Treatment/Interventions: Self-care/ADL training;DME and/or AE instruction;Patient/family education;Balance training    OT Goals(Current goals can be found in the care plan section) Acute Rehab OT Goals Patient Stated Goal: decrease pain OT Goal Formulation: With patient Time For Goal Achievement: 04/30/19 Potential to Achieve Goals: Good  OT Frequency: Min 2X/week           Co-evaluation PT/OT/SLP Co-Evaluation/Treatment: Yes Reason for Co-Treatment: For patient/therapist safety;To address functional/ADL transfers PT goals addressed during session: Mobility/safety with mobility;Balance;Strengthening/ROM;Proper use of DME OT goals addressed during session: ADL's and self-care;Strengthening/ROM      AM-PAC OT "6 Clicks" Daily Activity     Outcome Measure Help from another person eating meals?: None Help from another person taking care of personal grooming?: A Little Help from another person toileting, which includes using toliet, bedpan, or urinal?: A Little Help from another person bathing (including washing, rinsing, drying)?: A Lot Help from another person to put on and taking off regular upper body clothing?: A Little Help from another person to put on and taking off regular lower body clothing?: A Lot 6 Click Score: 17   End of Session Equipment Utilized During Treatment: Gait belt;Rolling walker;Back brace Nurse Communication: Mobility  status  Activity Tolerance: Patient tolerated treatment well Patient left: in chair;with call bell/phone within reach;with chair alarm set  OT Visit Diagnosis: Unsteadiness on feet (R26.81);Other abnormalities of gait and mobility (R26.89);Pain Pain - part of body: (back)                Time: 3154-0086 OT Time Calculation (min): 23 min Charges:  OT General Charges $OT Visit: 1 Visit OT Evaluation $OT Eval Moderate Complexity: 1 Mod  Ignacia Palma, OTR/L Acute [employer] Pager [phone] Office [phone]     Evette Georges 04/16/2019, 4:58 PM

## 2019-04-16 NOTE — Progress Notes (Signed)
Patient ID: Faris Sollars, male   DOB: [birth year], 78 y.o.   MRN: [id number] Subjective: Patient reports back a little sore, legs are fine, no NTW  Objective: Vital signs in last 24 hours: Temp:  [97 F (36.1 C)-98.3 F (36.8 C)] 98 F (36.7 C) (08/06 0507) Pulse Rate:  [57-79] 77 (08/06 0507) Resp:  [12-31] 15 (08/06 0507) BP: (57-122)/(44-71) 96/65 (08/06 0507) SpO2:  [95 %-100 %] 98 % (08/06 0507) Weight:  [96.6 kg] 96.6 kg (08/05 0923)  Intake/Output from previous day: 08/05 0701 - 08/06 0700 In: 3426.4 [I.V.:3075.3; IV Piggyback:351.1] Out: 2695 [Urine:1825; Drains:570; Blood:300] Intake/Output this shift: No intake/output data recorded.  Neurologic: Grossly normal, good strength in legs  Lab Results: Lab Results  Component Value Date   WBC 6.6 04/09/2019   HGB 9.1 (L) 04/09/2019   HCT 29.2 (L) 04/09/2019   MCV 83.4 04/09/2019   PLT 188 04/09/2019   Lab Results  Component Value Date   INR 1.5 (H) 04/09/2019   BMET Lab Results  Component Value Date   NA 136 04/09/2019   K 4.5 04/09/2019   CL 103 04/09/2019   CO2 26 04/09/2019   GLUCOSE 100 (H) 04/09/2019   BUN 24 (H) 04/09/2019   CREATININE 1.21 04/09/2019   CALCIUM 9.9 04/09/2019    Studies/Results: Dg Lumbar Spine 2-3 Views  Result Date: 04/15/2019 CLINICAL DATA:  T10-L4 instrumentation EXAM: LUMBAR SPINE - 2-3 VIEW; DG C-ARM 61-120 MIN COMPARISON:  CT thoracic and lumbar spine 03/18/2019 FLUOROSCOPY TIME:  2 minutes 39 seconds Images obtained: 6 FINDINGS: Osseous demineralization. Prior CT demonstrates marked compression deformity of the L1 vertebral body. BILATERAL pedicle screws and posterior bars are identified from T10 to L4. Marked compression fracture of L1 vertebral body again seen. Remaining vertebral bodies appear normal in alignment. Subtle superior endplate deformity of T11 with height loss is noted. IMPRESSION: Posterior fusion T10-L4. Marked compression fracture of L1 vertebral body.  Electronically Signed   By: Ulyses Southward M.D.   On: 04/15/2019 13:59   Nm Myocar Multi W/spect W/wall Motion / Ef  Result Date: 04/14/2019  There was no ST segment deviation noted during stress.  No T wave inversion was noted during stress.  Defect 1: There is a large defect of severe severity present in the basal inferior, mid inferior, apical inferior and apex location.  Findings consistent with prior myocardial infarction with peri-infarct ischemia.  The left ventricular ejection fraction is mildly decreased (45-54%).  This is a low risk study.  Nuclear stress EF: 47%.  Abnormal low risk stress nuclear study with prior inferior infarct and very mild peri-infarct ischemia.  Gated ejection fraction 47% with hypokinesis of the inferior wall.   Dg C-arm 1-60 Min  Result Date: 04/15/2019 CLINICAL DATA:  T10-L4 instrumentation EXAM: LUMBAR SPINE - 2-3 VIEW; DG C-ARM 61-120 MIN COMPARISON:  CT thoracic and lumbar spine 03/18/2019 FLUOROSCOPY TIME:  2 minutes 39 seconds Images obtained: 6 FINDINGS: Osseous demineralization. Prior CT demonstrates marked compression deformity of the L1 vertebral body. BILATERAL pedicle screws and posterior bars are identified from T10 to L4. Marked compression fracture of L1 vertebral body again seen. Remaining vertebral bodies appear normal in alignment. Subtle superior endplate deformity of T11 with height loss is noted. IMPRESSION: Posterior fusion T10-L4. Marked compression fracture of L1 vertebral body. Electronically Signed   By: Ulyses Southward M.D.   On: 04/15/2019 13:59    Assessment/Plan: Doing really well, mobilize today  Estimated body mass index is 28.88 kg/m as  calculated from the following:   Height as of this encounter: 6' (1.829 m).   Weight as of this encounter: 96.6 kg.    LOS: 3 days    Eustace Moore 04/16/2019, 8:06 AM

## 2019-04-17 NOTE — Progress Notes (Signed)
Physical Therapy Treatment Patient Details Name: Jake Johnson MRN: [id number] DOB: Jul 13, 1941 Today's Date: 04/17/2019    History of Present Illness Pt is a 78 y/o male s/p posterior fusion at T9-L4. PMH including but not limited to HTN and AAA.    PT Comments    Patient progressing well towards PT goals. Able to perform log roll technique with HOB flat with close min guard for safety using rail to simulate table to pull up on at home. Tolerated gait training and stair training with Min guard assist for safety. Pt with 2-3/4 DOE with mobility requiring standing rest breaks. Able to demonstrate back precautions and state things he has learned in rehab. Pt's biggest concern is getting into his truck so he can accompany his wife on errands. Talked through car transfer. Will continue to follow.    Follow Up Recommendations  Home health PT;Supervision for mobility/OOB     Equipment Recommendations  3in1 (PT)    Recommendations for Other Services       Precautions / Restrictions Precautions Precautions: Fall;Back Precaution Booklet Issued: No Precaution Comments: Reviewed back precautions Required Braces or Orthoses: Spinal Brace Spinal Brace: Thoracolumbosacral orthotic;Applied in sitting position Restrictions Weight Bearing Restrictions: No    Mobility  Bed Mobility Overal bed mobility: Needs Assistance Bed Mobility: Rolling;Sidelying to Sit Rolling: Min guard Sidelying to sit: Min guard       General bed mobility comments: HOB flat, and use of rail to simulate home. Good demo of log roll technique.  Transfers Overall transfer level: Needs assistance Equipment used: Rolling walker (2 wheeled) Transfers: Sit to/from Stand Sit to Stand: Min guard;From elevated surface         General transfer comment: Min guard for safety to stand; able to problem solve where to place hands prior to standing. Transferred to chair post ambulation.  Ambulation/Gait Ambulation/Gait  assistance: Min guard Gait Distance (Feet): 200 Feet Assistive device: Rolling walker (2 wheeled) Gait Pattern/deviations: Step-through pattern Gait velocity: decreased   General Gait Details: pt with slow, steady gait with use of RW; "I know I need to stay in the box."   Stairs Stairs: Yes Stairs assistance: Min guard Stair Management: One rail Left;Step to pattern Number of Stairs: 5 General stair comments: Cues for technique and safety.   Wheelchair Mobility    Modified Rankin (Stroke Patients Only)       Balance Overall balance assessment: Needs assistance Sitting-balance support: Feet supported;No upper extremity supported Sitting balance-Leahy Scale: Good Sitting balance - Comments: Needs total A to donn TLSO. OT has family session to review how to donn sitting EOB today.   Standing balance support: During functional activity Standing balance-Leahy Scale: Poor Standing balance comment: Requires UE support for dynamic standing.                            Cognition Arousal/Alertness: Awake/alert Behavior During Therapy: WFL for tasks assessed/performed Overall Cognitive Status: No family/caregiver present to determine baseline cognitive functioning                                 General Comments: Seems WFL for basic mobility tasks. Able to state how to donn socks, log roll technique, certan things he learned at rehab etc.      Exercises      General Comments        Pertinent Vitals/Pain Pain Assessment: Faces Faces  Pain Scale: Hurts little more Pain Location: back Pain Descriptors / Indicators: Sore;Operative site guarding Pain Intervention(s): Repositioned;Monitored during session    Home Living                      Prior Function            PT Goals (current goals can now be found in the care plan section) Progress towards PT goals: Progressing toward goals    Frequency    Min 5X/week      PT Plan  Current plan remains appropriate    Co-evaluation              AM-PAC PT "6 Clicks" Mobility   Outcome Measure  Help needed turning from your back to your side while in a flat bed without using bedrails?: A Little Help needed moving from lying on your back to sitting on the side of a flat bed without using bedrails?: A Little Help needed moving to and from a bed to a chair (including a wheelchair)?: A Little Help needed standing up from a chair using your arms (e.g., wheelchair or bedside chair)?: A Little Help needed to walk in hospital room?: A Little Help needed climbing 3-5 steps with a railing? : A Little 6 Click Score: 18    End of Session Equipment Utilized During Treatment: Gait belt;Back brace Activity Tolerance: Patient tolerated treatment well Patient left: in chair;with call bell/phone within reach;with chair alarm set Nurse Communication: Mobility status PT Visit Diagnosis: Other abnormalities of gait and mobility (R26.89);Pain Pain - part of body: (back)     Time: 6333-5456 PT Time Calculation (min) (ACUTE ONLY): 23 min  Charges:  $Gait Training: 8-22 mins $Therapeutic Activity: 8-22 mins                     Wray Kearns, [place], DPT Acute Rehabilitation Services Pager [phone] Office [place] 04/17/2019, 10:41 AM

## 2019-04-17 NOTE — Progress Notes (Signed)
   Patient wishes to follow with [facility] for cardiology care post discharge. He lives in [place] and would prefer to follow with Dr. Stanford Breed who holds clinic in [place] periodically. Unfortunately there were no available appointment for the patient to be seen in 1 month. Will have him follow at our [facility] office for the first visit and hopefully will be able to see Dr. Stanford Breed in [place] going forward.   Abigail Butts, PA-C 04/17/19; 10:43 AM

## 2019-04-17 NOTE — Plan of Care (Signed)

## 2019-04-17 NOTE — Progress Notes (Signed)
Occupational Therapy Treatment and Discharge Patient Details Name: Jake Johnson MRN: [id number] DOB: 1941/08/15 Today's Date: 04/17/2019    History of present illness Pt is a 78 y/o male s/p posterior fusion at T9-L4. PMH including but not limited to HTN and AAA.   OT comments  This 78 yo male seen today with his wife present as well to go over some basic ADL education. No further questions from pt or family, we will sign off.  Follow Up Recommendations  Home health OT;Supervision/Assistance - 24 hour    Equipment Recommendations  None recommended by OT       Precautions / Restrictions Precautions Precautions: Fall;Back Precaution Booklet Issued: Yes (comment) Precaution Comments: Reviewed back precaution hand out with patient and wife Required Braces or Orthoses: Spinal Brace Spinal Brace: Thoracolumbosacral orthotic;Applied in sitting position(can have off to shower and night time trips to bathroom)       Mobility Bed Mobility               General bed mobility comments: Pt sitting up in recliner upon my arrival  Transfers Overall transfer level: Needs assistance Equipment used: Rolling walker (2 wheeled) Transfers: Sit to/from Stand Sit to Stand: Supervision              Balance Overall balance assessment: Needs assistance Sitting-balance support: No upper extremity supported;Feet supported Sitting balance-Leahy Scale: Fair     Standing balance support: During functional activity;Single extremity supported Standing balance-Leahy Scale: Poor                             ADL either performed or assessed with clinical judgement   ADL Overall ADL's : Needs assistance/impaired                                       General ADL Comments: wife reports she will continue to A him with his LBADLs, she is now aware he can don and doff brace at EOB (reports RN showed her how) and that pt can shower without brace. We dicussed the brushing  of teeth and using 2 cups do as to not bend over sink for oral care and use of wet wipes for back peri area. We also discussed not sitting for more than an hour at at time (need to get up and walk around with A then can sit another hour)     Vision Patient Visual Report: No change from baseline            Cognition Arousal/Alertness: Awake/alert Behavior During Therapy: WFL for tasks assessed/performed Overall Cognitive Status: Impaired/Different from baseline Area of Impairment: Problem solving                             Problem Solving: Requires verbal cues                     Pertinent Vitals/ Pain       Pain Assessment: 0-10 Pain Score: 5  Pain Location: back Pain Descriptors / Indicators: Sore;Operative site guarding Pain Intervention(s): Repositioned;Monitored during session         Frequency  Min 2X/week        Progress Toward Goals  OT Goals(current goals can now be found in the care plan section)  Progress towards OT goals: (All acute  OT education completed)     Plan Discharge plan remains appropriate       AM-PAC OT "6 Clicks" Daily Activity     Outcome Measure   Help from another person eating meals?: None Help from another person taking care of personal grooming?: A Little Help from another person toileting, which includes using toliet, bedpan, or urinal?: A Little Help from another person bathing (including washing, rinsing, drying)?: A Lot Help from another person to put on and taking off regular upper body clothing?: A Little Help from another person to put on and taking off regular lower body clothing?: A Lot 6 Click Score: 17    End of Session Equipment Utilized During Treatment: Gait belt;Rolling walker;Back brace  OT Visit Diagnosis: Unsteadiness on feet (R26.81);Other abnormalities of gait and mobility (R26.89);Pain Pain - part of body: (incisioanl)   Activity Tolerance Patient tolerated treatment well   Patient  Left in chair;with call bell/phone within reach;with chair alarm set;with family/visitor present           Time: 7425-9563 OT Time Calculation (min): 22 min  Charges: OT General Charges $OT Visit: 1 Visit OT Treatments $Self Care/Home Management : 8-22 mins  [place], OTR/L Acute [employer] Pager [phone] Office [phone]      Almon Register 04/17/2019, 10:05 PM

## 2019-04-17 NOTE — TOC Initial Note (Signed)
Transition of Care [facility]) - Initial/Assessment Note    Patient Details  Name: Jake Johnson MRN: [id number] Date of Birth: 1941/07/01  [facility]) CM/SW Contact:    Ella Bodo, RN Phone Number: 04/17/2019, 2:53 PM  Clinical Narrative:  Pt is a 78 y/o male s/p posterior fusion at T9-L4.  PTA, pt required assistance with ADLS; lives with spouse.  PT/OT recommending HH follow up, and pt agreeable to services.  Referral to [facility], per pt/wife choice.  Wife states pt has RW and 3 in 1 at home, and has no DME needs.                 Expected Discharge Plan: [place] Barriers to Discharge: Barriers Resolved   Patient Goals and CMS Choice   CMS Medicare.gov Compare Post Acute Care list provided to:: Patient Choice offered to / list presented to : Spouse, Patient  Expected Discharge Plan and Services Expected Discharge Plan: [place]   Discharge Planning Services: CM Consult Post Acute Care Choice: [place] arrangements for the past 2 months: Single Family Home                           HH Arranged: PT, OT [place] Agency: [facility] Date [place]: 04/17/19 Time [place]: 8242 Representative spoke with at [place]: Pollie Meyer  Prior Living Arrangements/Services Living arrangements for the past 2 months: [place] with:: Spouse Patient language and need for interpreter reviewed:: Yes Do you feel safe going back to the place where you live?: Yes      Need for Family Participation in Patient Care: Yes (Comment) Care giver support system in place?: Yes (comment) Current home services: DME Criminal Activity/Legal Involvement Pertinent to Current Situation/Hospitalization: No - Comment as needed  Activities of Daily Living Home Assistive Devices/Equipment: Eyeglasses, CPAP, Cane (specify quad or straight) ADL Screening (condition at time of  admission) Patient's cognitive ability adequate to safely complete daily activities?: Yes Is the patient deaf or have difficulty hearing?: No Does the patient have difficulty seeing, even when wearing glasses/contacts?: No Does the patient have difficulty concentrating, remembering, or making decisions?: No Patient able to express need for assistance with ADLs?: Yes Does the patient have difficulty dressing or bathing?: No Independently performs ADLs?: Yes (appropriate for developmental age) Does the patient have difficulty walking or climbing stairs?: Yes Weakness of Legs: Both Weakness of Arms/Hands: None  Permission Sought/Granted Permission sought to share information with : Facility Art therapist granted to share information with : Yes, Verbal Permission Granted     Permission granted to share info w AGENCY: [facility]        Emotional Assessment Appearance:: Appears stated age Attitude/Demeanor/Rapport: Engaged Affect (typically observed): Accepting Orientation: : Oriented to Self, Oriented to Place, Oriented to  Time, Oriented to Situation Alcohol / Substance Use: Not Applicable Psych Involvement: No (comment)  Admission diagnosis:  Discitis Patient Active Problem List   Diagnosis Date Noted  . S/P lumbar fusion 04/15/2019  . Diskitis 04/13/2019   PCP:  Mackey Birchwood, NP Pharmacy:   [facility] [id number] - [place], [place] [address] AT [address] [place] [address] Phone: [phone] Fax: [phone]      Readmission Risk Interventions Readmission Risk Prevention Plan 04/17/2019  Post Dischage Appt  Complete  Medication Screening Complete  Transportation Screening Complete   Quintella Baton, RN, BSN  Trauma/Neuro ICU Case Manager [phone]

## 2019-04-17 NOTE — Progress Notes (Signed)
Progress Note  Patient Name: Jake Johnson Date of Encounter: 04/17/2019  Primary Cardiologist:   No primary care provider on file.   Subjective   Breathing OK.  No chest pain.  No SOB.   Inpatient Medications    Scheduled Meds: . aspirin  325 mg Oral Daily  . celecoxib  200 mg Oral Q12H  . doxycycline  100 mg Oral BID  . furosemide  20 mg Oral Daily  . latanoprost  1 drop Both Eyes QHS  . metoprolol succinate  25 mg Oral QHS  . metoprolol succinate  50 mg Oral Daily  . multivitamin with minerals  1 tablet Oral Daily  . senna  1 tablet Oral BID  . sodium chloride flush  3 mL Intravenous Q12H   Continuous Infusions: . sodium chloride    . 0.9 % NaCl with KCl 20 mEq / L 75 mL/hr at 04/15/19 1749  . methocarbamol (ROBAXIN) IV     PRN Meds: acetaminophen **OR** acetaminophen, menthol-cetylpyridinium **OR** phenol, methocarbamol **OR** methocarbamol (ROBAXIN) IV, morphine injection, ondansetron **OR** ondansetron (ZOFRAN) IV, oxyCODONE, sodium chloride flush   Vital Signs    Vitals:   04/16/19 2031 04/16/19 2149 04/17/19 0005 04/17/19 0452  BP: 97/72 (!) 106/59 98/61 98/62   Pulse: 72 73 63 73  Resp: 16  16 19   Temp: 98.6 F (37 C)  98.8 F (37.1 C) 98.2 F (36.8 C)  TempSrc: Oral  Oral Oral  SpO2: 100%  100% 100%  Weight:      Height:        Intake/Output Summary (Last 24 hours) at 04/17/2019 0937 Last data filed at 04/17/2019 0847 Gross per 24 hour  Intake 6 ml  Output 900 ml  Net -894 ml   Filed Weights   04/13/19 0933 04/14/19 0438 04/15/19 0923  Weight: 93 kg 96.6 kg 96.6 kg    Telemetry    NA - Personally Reviewed  ECG    NA - Personally Reviewed  Physical Exam   GEN: No  acute distress.   Neuro:   Nonfocal  Psych: Oriented and appropriate  Lungs:  Clear COR:  Cannot examine with brace in place    Labs    Chemistry No results for input(s): NA, K, CL, CO2, GLUCOSE, BUN, CREATININE, CALCIUM, PROT, ALBUMIN, AST, ALT, ALKPHOS, BILITOT,  GFRNONAA, GFRAA, ANIONGAP in the last 168 hours.   Hematology No results for input(s): WBC, RBC, HGB, HCT, MCV, MCH, MCHC, RDW, PLT in the last 168 hours.  Cardiac EnzymesNo results for input(s): TROPONINI in the last 168 hours. No results for input(s): TROPIPOC in the last 168 hours.   BNPNo results for input(s): BNP, PROBNP in the last 168 hours.   DDimer No results for input(s): DDIMER in the last 168 hours.   Radiology    Dg Lumbar Spine 2-3 Views  Result Date: 04/15/2019 CLINICAL DATA:  T10-L4 instrumentation EXAM: LUMBAR SPINE - 2-3 VIEW; DG C-ARM 61-120 MIN COMPARISON:  CT thoracic and lumbar spine 03/18/2019 FLUOROSCOPY TIME:  2 minutes 39 seconds Images obtained: 6 FINDINGS: Osseous demineralization. Prior CT demonstrates marked compression deformity of the L1 vertebral body. BILATERAL pedicle screws and posterior bars are identified from T10 to L4. Marked compression fracture of L1 vertebral body again seen. Remaining vertebral bodies appear normal in alignment. Subtle superior endplate deformity of Q22 with height loss is noted. IMPRESSION: Posterior fusion T10-L4. Marked compression fracture of L1 vertebral body. Electronically Signed   By: Lavonia Dana M.D.   On:  04/15/2019 13:59   Dg C-arm 1-60 Min  Result Date: 04/15/2019 CLINICAL DATA:  T10-L4 instrumentation EXAM: LUMBAR SPINE - 2-3 VIEW; DG C-ARM 61-120 MIN COMPARISON:  CT thoracic and lumbar spine 03/18/2019 FLUOROSCOPY TIME:  2 minutes 39 seconds Images obtained: 6 FINDINGS: Osseous demineralization. Prior CT demonstrates marked compression deformity of the L1 vertebral body. BILATERAL pedicle screws and posterior bars are identified from T10 to L4. Marked compression fracture of L1 vertebral body again seen. Remaining vertebral bodies appear normal in alignment. Subtle superior endplate deformity of T11 with height loss is noted. IMPRESSION: Posterior fusion T10-L4. Marked compression fracture of L1 vertebral body. Electronically  Signed   By: Ulyses Southward M.D.   On: 04/15/2019 13:59    Cardiac Studies   Echo   1. The left ventricle has mild-moderately reduced systolic function, with an ejection fraction of 40-45%. The cavity size was normal. There is mildly increased left ventricular wall thickness. Left ventricular diastolic Doppler parameters are  indeterminate. Left ventricular diffuse hypokinesis. Technically difficult images, poor visualization of LV wall segments.  2. The right ventricle has mildly reduced systolic function. The cavity was mildly enlarged. There is no increase in right ventricular wall thickness.  3. Left atrial size was moderately dilated.  4. Right atrial size was mildly dilated.  5. Trivial pericardial effusion is present.  6. Mild calcification of the mitral valve leaflet. No evidence of mitral valve stenosis. No significant regurgitation.  7. The aortic valve is tricuspid. Severe calcifcation of the aortic valve. No stenosis of the aortic valve.  8. The aorta is normal in size and structure.  9. The IVC was not visualized. No complete TR doppler jet so unable to estimate PA systolic pressure.   Patient Profile     78 y.o. male with a hx hypertension, sleep apnea previously on CPAP, AAA, kidney stone and recent diagnosis of atrial fibrillation and a reduced EF at [facility].  We were asked to see pre op before back surgery.  Assessment & Plan    ATRIAL FIB:   Rate controlled.   Amiodarone stopped.  OK to stop tele.  Please make sure to stop ASA and start Xarelto at discharge or give instructions to him when it is safe to do this.  I am going to reduce his beta blocker.     CARDIOMYOPATHY:  EF is mildly reduced as above.  Lexiscan Myoview as above.  Continue medical management.  Unable to titrate meds with low BP.  Unable to titrate meds.    [facility] will sign off.   Medication Recommendations:  Meds as on MAR Other recommendations (labs, testing, etc):  None Follow up as an  outpatient:  We will arrange follow up in our office.  This should appear on his discharge instructions.      For questions or updates, please contact [facility] Please consult [email or web address] for contact info under Cardiology/STEMI.   Signed, [name], MD  04/17/2019, 9:37 AM

## 2019-04-17 NOTE — Progress Notes (Signed)
Patient ID: Jake Johnson, male   DOB: Jul 30, 1941, 77 y.o.   MRN: [id number] Subjective: Patient reports some soreness, no leg sxs  Objective: Vital signs in last 24 hours: Temp:  [98.1 F (36.7 C)-98.8 F (37.1 C)] 98.2 F (36.8 C) (08/07 0452) Pulse Rate:  [63-92] 73 (08/07 0452) Resp:  [15-23] 19 (08/07 0452) BP: (97-109)/(51-79) 98/62 (08/07 0452) SpO2:  [95 %-100 %] 100 % (08/07 0452)  Intake/Output from previous day: 08/06 0701 - 08/07 0700 In: 6 [I.V.:6] Out: 900 [Urine:650; Drains:250] Intake/Output this shift: No intake/output data recorded.  Neurologic: Grossly normal  Lab Results: Lab Results  Component Value Date   WBC 6.6 04/09/2019   HGB 9.1 (L) 04/09/2019   HCT 29.2 (L) 04/09/2019   MCV 83.4 04/09/2019   PLT 188 04/09/2019   Lab Results  Component Value Date   INR 1.5 (H) 04/09/2019   BMET Lab Results  Component Value Date   NA 136 04/09/2019   K 4.5 04/09/2019   CL 103 04/09/2019   CO2 26 04/09/2019   GLUCOSE 100 (H) 04/09/2019   BUN 24 (H) 04/09/2019   CREATININE 1.21 04/09/2019   CALCIUM 9.9 04/09/2019    Studies/Results: Dg Lumbar Spine 2-3 Views  Result Date: 04/15/2019 CLINICAL DATA:  T10-L4 instrumentation EXAM: LUMBAR SPINE - 2-3 VIEW; DG C-ARM 61-120 MIN COMPARISON:  CT thoracic and lumbar spine 03/18/2019 FLUOROSCOPY TIME:  2 minutes 39 seconds Images obtained: 6 FINDINGS: Osseous demineralization. Prior CT demonstrates marked compression deformity of the L1 vertebral body. BILATERAL pedicle screws and posterior bars are identified from T10 to L4. Marked compression fracture of L1 vertebral body again seen. Remaining vertebral bodies appear normal in alignment. Subtle superior endplate deformity of A16 with height loss is noted. IMPRESSION: Posterior fusion T10-L4. Marked compression fracture of L1 vertebral body. Electronically Signed   By: Lavonia Dana M.D.   On: 04/15/2019 13:59   Dg C-arm 1-60 Min  Result Date: 04/15/2019 CLINICAL DATA:   T10-L4 instrumentation EXAM: LUMBAR SPINE - 2-3 VIEW; DG C-ARM 61-120 MIN COMPARISON:  CT thoracic and lumbar spine 03/18/2019 FLUOROSCOPY TIME:  2 minutes 39 seconds Images obtained: 6 FINDINGS: Osseous demineralization. Prior CT demonstrates marked compression deformity of the L1 vertebral body. BILATERAL pedicle screws and posterior bars are identified from T10 to L4. Marked compression fracture of L1 vertebral body again seen. Remaining vertebral bodies appear normal in alignment. Subtle superior endplate deformity of P53 with height loss is noted. IMPRESSION: Posterior fusion T10-L4. Marked compression fracture of L1 vertebral body. Electronically Signed   By: Lavonia Dana M.D.   On: 04/15/2019 13:59    Assessment/Plan: Doing well, hopefully home tomorrow  Estimated body mass index is 28.88 kg/m as calculated from the following:   Height as of this encounter: 6' (1.829 m).   Weight as of this encounter: 96.6 kg.    LOS: 4 days    Eustace Moore 04/17/2019, 8:26 AM

## 2019-04-18 MED ORDER — OXYCODONE HCL 5 MG PO TABS: 5.0000 mg | ORAL_TABLET | ORAL | 0 refills | Status: DC | PRN

## 2019-04-18 MED ORDER — METHOCARBAMOL 500 MG PO TABS: 500.0000 mg | ORAL_TABLET | Freq: Four times a day (QID) | ORAL | 3 refills | Status: DC | PRN

## 2019-04-18 NOTE — Progress Notes (Signed)
Physical Therapy Treatment Patient Details Name: Jake Johnson MRN: [id number] DOB: 02-09-1941 Today's Date: 04/18/2019    History of Present Illness Pt is a 78 y/o male s/p posterior fusion at T9-L4. PMH including but not limited to HTN and AAA.    PT Comments    Patient progressing well towards PT goals. Improved ambulation distance with Min guard assist for balance/safety. Tolerated stair training with mostly Min guard but needed Min A towards the end due to bil knee instability. Pt able to recall 2/3 back precautions. Reviewed truck transfer technique as well as walking program and using RW instead of cane. Pt agreeable. Recommend walking 2 more times today with nursing staff. Plans to d/c home with support of wife. Will follow.  Follow Up Recommendations  Home health PT;Supervision for mobility/OOB     Equipment Recommendations  3in1 (PT)    Recommendations for Other Services       Precautions / Restrictions Precautions Precautions: Fall;Back Precaution Booklet Issued: Yes (comment) Precaution Comments: Reviewed back precautions Required Braces or Orthoses: Spinal Brace Spinal Brace: Thoracolumbosacral orthotic;Applied in sitting position Restrictions Weight Bearing Restrictions: No    Mobility  Bed Mobility               General bed mobility comments: Pt sitting up in recliner upon my arrival  Transfers Overall transfer level: Needs assistance Equipment used: Rolling walker (2 wheeled) Transfers: Sit to/from Stand Sit to Stand: Min guard         General transfer comment: Multiple attempts to stand from low recliner using momentum; Min guard for safety. Cues for hand placement.  Ambulation/Gait Ambulation/Gait assistance: Min guard Gait Distance (Feet): 500 Feet Assistive device: Rolling walker (2 wheeled) Gait Pattern/deviations: Step-through pattern;Trunk flexed Gait velocity: decreased   General Gait Details: pt with slow, steady gait with use of RW;  cues for RW proximity and management. Also for upright posture. Practiced walking on slight incline/decline.   Stairs Stairs: Yes Stairs assistance: Min guard;Min assist Stair Management: One rail Left;Step to pattern Number of Stairs: 13 General stair comments: Cues for technique and safety. Towards top of stairs, pt with bil knee instability requiring Min A.   Wheelchair Mobility    Modified Rankin (Stroke Patients Only)       Balance Overall balance assessment: Needs assistance Sitting-balance support: No upper extremity supported;Feet supported Sitting balance-Leahy Scale: Fair     Standing balance support: During functional activity Standing balance-Leahy Scale: Poor Standing balance comment: Requires UE support for dynamic standing.                            Cognition Arousal/Alertness: Awake/alert Behavior During Therapy: WFL for tasks assessed/performed Overall Cognitive Status: Impaired/Different from baseline Area of Impairment: Problem solving;Memory                     Memory: Decreased recall of precautions       Problem Solving: Requires verbal cues        Exercises      General Comments General comments (skin integrity, edema, etc.): Wife present during session.      Pertinent Vitals/Pain Pain Assessment: Faces Faces Pain Scale: No hurt    Home Living                      Prior Function            PT Goals (current goals can now be  found in the care plan section) Progress towards PT goals: Progressing toward goals    Frequency    Min 5X/week      PT Plan Current plan remains appropriate    Co-evaluation              AM-PAC PT "6 Clicks" Mobility   Outcome Measure  Help needed turning from your back to your side while in a flat bed without using bedrails?: A Little Help needed moving from lying on your back to sitting on the side of a flat bed without using bedrails?: A Little Help needed  moving to and from a bed to a chair (including a wheelchair)?: A Little Help needed standing up from a chair using your arms (e.g., wheelchair or bedside chair)?: A Little Help needed to walk in hospital room?: A Little Help needed climbing 3-5 steps with a railing? : A Little 6 Click Score: 18    End of Session Equipment Utilized During Treatment: Gait belt;Back brace Activity Tolerance: Patient tolerated treatment well Patient left: in chair;with call bell/phone within reach;with chair alarm set;with family/visitor present Nurse Communication: Mobility status PT Visit Diagnosis: Other abnormalities of gait and mobility (R26.89)     Time: 3662-9476 PT Time Calculation (min) (ACUTE ONLY): 22 min  Charges:  $Gait Training: 8-22 mins                     Wray Kearns, PT, DPT Acute Rehabilitation Services Pager [phone] Office [place] 04/18/2019, 12:52 PM

## 2019-04-18 NOTE — Discharge Summary (Signed)
Physician Discharge Summary  Patient ID: Jake Johnson MRN: [id number] DOB/AGE: June 23, 1941 78 y.o.  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  Destruction of L1 vertebral body.  Instability.  Kyphosis.  Back pain. 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  He tolerated surgery well.  Consults: Cardiology, hospitalist  Significant Diagnostic Studies: None  Treatments: surgery: Posterior stabilization from T9-L4 with post dural lateral grafting with autograft and allograft T9 L4  Discharge Exam: Blood pressure 99/63, pulse 64, temperature 98 F (36.7 C), temperature source Oral, resp. rate 16, height 6' (1.829 m), weight 96.6 kg, SpO2 100 %. Incision is clean and dry motor function is intact Station and gait are intact  Disposition: Discharge disposition: 01-Home or Self Care       Discharge Instructions    Call MD for:  redness, tenderness, or signs of infection (pain, swelling, redness, odor or green/yellow discharge around incision site)   Complete by: As directed    Call MD for:  severe uncontrolled pain   Complete by: As directed    Call MD for:  temperature >100.4   Complete by: As directed    Diet - low sodium heart healthy   Complete by: As directed    Increase activity slowly   Complete by: As directed      Allergies as of 04/18/2019   No Known Allergies     Medication List    TAKE these medications   amiodarone 200 MG tablet Commonly known as: PACERONE Take 200 mg by mouth daily.   atorvastatin 40 MG tablet Commonly known as: LIPITOR Take 40 mg by mouth at bedtime.   bisacodyl 5 MG EC tablet Commonly known as:  DULCOLAX Take 5 mg by mouth daily.   doxycycline 100 MG capsule Commonly known as: VIBRAMYCIN Take 100 mg by mouth 2 (two) times daily.   furosemide 40 MG tablet Commonly known as: LASIX Take 40 mg by mouth daily.   latanoprost 0.005 % ophthalmic solution Commonly known as: XALATAN Place 1 drop into both eyes at bedtime.   methocarbamol 500 MG tablet Commonly known as: ROBAXIN Take 1 tablet (500 mg total) by mouth every 6 (six) hours as needed for muscle spasms.   metoprolol succinate 50 MG 24 hr tablet Commonly known as: TOPROL-XL Take 50 mg by mouth daily.   metoprolol succinate 25 MG 24 hr tablet Commonly known as: TOPROL-XL Take 25 mg by mouth at bedtime.   multivitamin with minerals Tabs tablet Take 1 tablet by mouth daily.   oxyCODONE 5 MG immediate release tablet Commonly known as: Oxy IR/ROXICODONE Take 1-2 tablets (5-10 mg total) by mouth every 3 (three) hours as needed for moderate pain ((score 4 to 6)). What changed:   medication strength  how much to take  when to take this  reasons to take this   polyethylene glycol 17 g packet Commonly known as: MIRALAX / GLYCOLAX Take 17 g by mouth daily.   rivaroxaban 20 MG Tabs tablet Commonly known as: XARELTO Take 20 mg by mouth daily.            Durable Medical Equipment  (From admission, onward)         Start  Ordered   04/15/19 1604  DME Walker rolling  Once    Question:  Patient needs a walker to treat with the following condition  Answer:  S/P lumbar fusion   04/15/19 1603   04/15/19 1604  DME 3 n 1  Once     04/15/19 1603         Follow-up Information    Erlene Quan, PA-C Follow up on 05/20/2019.   Specialties: Cardiology, Radiology Why: Please arrive 15 minutes early for your 9:45am post-hospital follow-up appointment. You can follow with Dr. Stanford Breed in [place] going forward but there were not any appointments available at this time.  Contact information: [facility] [place] [address] [phone]           Signed: Earleen Newport 04/18/2019, 10:31 AM

## 2019-04-18 NOTE — Progress Notes (Signed)
Patient given discharge education and all questions were answered. No printed prescriptions to give or equipment to deliver. IV's removed. Patient taken to car with all belongings.

## 2019-05-06 NOTE — Progress Notes (Addendum)
Cardiology Office Note   Date:  05/07/2019   ID:  Jake Johnson, DOB 12-26-1940, MRN [id number]  PCP:  Mackey Birchwood, NP  Cardiologist:   Kirk Ruths, MD   Chief Complaint  Patient presents with  . Atrial Fibrillation      History of Present Illness: Dontario Johnson is a 78 y.o. male who presents for follow up of atrial fib.  We saw him recently in the hospital preop prior to back surgery.   He had atrial fib which had recently been diagnosed at [facility].  He was also noted to have a reduced EF.  We managed this medically. He did have a negative perfusion study.     He has been doing physical therapy.  He is doing very well from this standpoint though he still wearing a back brace.  I had stopped his amiodarone in the hospital but for some reason it was restarted at discharge.  He was also sent home on compression stockings which he does not like to wear.  He is not having any new shortness of breath, PND or orthopnea.  He is not having any palpitations, presyncope or syncope.  He is tolerating his anticoagulation.  He is never noticed his fibrillation.  His wife says he was never aware that he had this rhythm or his slightly weak heart muscle though this was all documented at [facility].      Past Medical History:  Diagnosis Date  . AAA (abdominal aortic aneurysm) ([place])   . Atrial fibrillation ([place])   . History of kidney stones   . Hypertension   . Sleep apnea    Does not use CPAP    Past Surgical History:  Procedure Laterality Date  . LAMINECTOMY WITH POSTERIOR LATERAL ARTHRODESIS LEVEL 4 N/A 04/15/2019   Procedure: Posterior Lateral Fusion with segmental instrumentation Thoracic ten-Lumbar four;  Surgeon: Eustace Moore, MD;  Location: [facility];  Service: Neurosurgery;  Laterality: N/A;  . NO PAST SURGERIES    . WISDOM TOOTH EXTRACTION       Current Outpatient Medications  Medication Sig Dispense Refill  . atorvastatin (LIPITOR) 40 MG tablet Take 40 mg by mouth at  bedtime.    Marland Kitchen doxycycline (VIBRAMYCIN) 100 MG capsule Take 100 mg by mouth 2 (two) times daily.    Marland Kitchen latanoprost (XALATAN) 0.005 % ophthalmic solution Place 1 drop into both eyes at bedtime.    . methocarbamol (ROBAXIN) 500 MG tablet Take 1 tablet (500 mg total) by mouth every 6 (six) hours as needed for muscle spasms. 40 tablet 3  . metoprolol succinate (TOPROL-XL) 50 MG 24 hr tablet Take 50 mg by mouth daily.    . Multiple Vitamin (MULTIVITAMIN WITH MINERALS) TABS tablet Take 1 tablet by mouth daily.    Marland Kitchen oxyCODONE (OXY IR/ROXICODONE) 5 MG immediate release tablet Take 1-2 tablets (5-10 mg total) by mouth every 3 (three) hours as needed for moderate pain ((score 4 to 6)). 60 tablet 0  . polyethylene glycol (MIRALAX / GLYCOLAX) 17 g packet Take 17 g by mouth daily.    . rivaroxaban (XARELTO) 20 MG TABS tablet Take 20 mg by mouth daily.    . furosemide (LASIX) 20 MG tablet 1 TABLET BY MOUTH DAILY FOR 3 DAYS ONLY 3 tablet 0   No current facility-administered medications for this visit.     Allergies:   Patient has no known allergies.    ROS:  Please see the history of present illness.   Otherwise,  review of systems are positive for none.   All other systems are reviewed and negative.    PHYSICAL EXAM: VS:  BP 108/60   Ht 6' (1.829 m)   Wt 216 lb 6.4 oz (98.2 kg)   BMI 29.35 kg/m  , BMI Body mass index is 29.35 kg/m. GENERAL:  Well appearing HEENT:  Pupils equal round and reactive, fundi not visualized, oral mucosa unremarkable NECK:  No jugular venous distention, waveform within normal limits, carotid upstroke brisk and symmetric, no bruits, no thyromegaly LYMPHATICS:  No cervical, inguinal adenopathy LUNGS:  Clear to auscultation bilaterally BACK:  No CVA tenderness CHEST:  Unremarkable HEART:  PMI not displaced or sustained,S1 and S2 within normal limits, no S3,  no clicks, no rubs, no murmurs, irregular ABD:  Flat, positive bowel sounds normal in frequency in pitch, no bruits, no  rebound, no guarding, no midline pulsatile mass, no hepatomegaly, no splenomegaly EXT:  2 plus pulses throughout, mild edema, no cyanosis no clubbing   EKG:  EKG is ordered today. The ekg ordered today demonstrates atrial fibrillation, rate 65, right bundle branch block, left anterior fascicular block, no acute ST-T wave changes.   Recent Labs: 04/09/2019: BUN 24; Creatinine, Ser 1.21; Hemoglobin 9.1; Platelets 188; Potassium 4.5; Sodium 136    Lipid Panel No results found for: CHOL, TRIG, HDL, CHOLHDL, VLDL, LDLCALC, LDLDIRECT    Wt Readings from Last 3 Encounters:  05/07/19 216 lb 6.4 oz (98.2 kg)  04/15/19 212 lb 15.4 oz (96.6 kg)  04/09/19 218 lb 6 oz (99.1 kg)      Other studies Reviewed: Additional studies/ records that were reviewed today include: Hospital records. Review of the above records demonstrates:  Please see elsewhere in the note.     ASSESSMENT AND PLAN:  ATRIAL FIB:   The patient does not notice his fibrillation.  He tolerates anticoagulation. Mr. Jake Johnson has a CHA2DS2 - VASc score of 3.  His rate is slower some to reduce his beta-blocker.  Also he was not to be sent home on amiodarone and I confirmed that I had stopped this in the hospital reviewing those records but he was sent home on this.  He will stop this.  CARDIOMYOPATHY: At this point his blood pressure is low.  I cannot titrate his medications.  I did have a discussion reviewed with the patient and his wife his ejection fraction and the plan for trying to titrate meds going forward.  HTN: As above this is being managed in the context of his cardiomyopathy and his blood pressure currently is low.  EDEMA: He will continue the Lasix as ordered.  I will check a BMET  Current medicines are reviewed at length with the patient today.  The patient does not have concerns regarding medicines.  The following changes have been made:  As above  Labs/ tests ordered today include:  No orders of the  defined types were placed in this encounter.    Disposition:   FU with me in 2 months.     Signed, [name], MD  05/07/2019 1:58 PM    [facility]

## 2019-05-07 ENCOUNTER — Ambulatory Visit (INDEPENDENT_AMBULATORY_CARE_PROVIDER_SITE_OTHER): Payer: Medicare Other | Admitting: Cardiology

## 2019-05-07 ENCOUNTER — Encounter: Payer: Self-pay | Admitting: Cardiology

## 2019-05-07 ENCOUNTER — Other Ambulatory Visit: Payer: Self-pay

## 2019-05-07 VITALS — BP 108/60 | Ht 72.0 in | Wt 216.4 lb

## 2019-05-07 DIAGNOSIS — Z5181 Encounter for therapeutic drug level monitoring: Secondary | ICD-10-CM | POA: Diagnosis not present

## 2019-05-07 DIAGNOSIS — I10 Essential (primary) hypertension: Secondary | ICD-10-CM | POA: Diagnosis not present

## 2019-05-07 DIAGNOSIS — I482 Chronic atrial fibrillation, unspecified: Secondary | ICD-10-CM | POA: Diagnosis not present

## 2019-05-07 DIAGNOSIS — I428 Other cardiomyopathies: Secondary | ICD-10-CM

## 2019-05-07 MED ORDER — FUROSEMIDE 20 MG PO TABS: ORAL_TABLET | ORAL | 0 refills | Status: DC

## 2019-05-07 NOTE — Addendum Note (Signed)
Addended by: Earvin Hansen on: 05/07/2019 06:28 PM   Modules accepted: Orders

## 2019-05-07 NOTE — Patient Instructions (Addendum)
Medication Instructions:  STOP AMIODARONE   STOP METOPROLOL 25 MG AND TAKE 50 MG DAILY ONLY   If you need a refill on your cardiac medications before your next appointment, please call your pharmacy.   Lab work: BMET TODAY   Testing/Procedures: NONE  Follow-Up: At [facility], you and your health needs are our priority.  As part of our continuing mission to provide you with exceptional heart care, we have created designated Provider Care Teams.  These Care Teams include your primary Cardiologist (physician) and Advanced Practice Providers (APPs -  Physician Assistants and Nurse Practitioners) who all work together to provide you with the care you need, when you need it. You will need a follow up appointment in 2 months.  You may see DR [place]  or one of the following Advanced Practice Providers on your designated Care Team:   Rosaria Ferries, PA-C . Jory Sims, DNP, ANP

## 2019-05-07 NOTE — Addendum Note (Signed)
Addended by: Alvina Filbert B on: 05/07/2019 02:13 PM   Modules accepted: Orders

## 2019-05-07 NOTE — Addendum Note (Signed)
Addended by: Alvina Filbert B on: 05/07/2019 02:09 PM   Modules accepted: Orders

## 2019-05-08 LAB — BASIC METABOLIC PANEL
BUN/Creatinine Ratio: 23 (ref 10–24)
BUN: 24 mg/dL (ref 8–27)
CO2: 22 mmol/L (ref 20–29)
Calcium: 9.9 mg/dL (ref 8.6–10.2)
Chloride: 101 mmol/L (ref 96–106)
Creatinine, Ser: 1.05 mg/dL (ref 0.76–1.27)
GFR calc Af Amer: 78 mL/min/{1.73_m2} (ref >59)
GFR calc non Af Amer: 67 mL/min/{1.73_m2} (ref >59)
Glucose: 111 mg/dL — ABNORMAL HIGH (ref 65–99)
Potassium: 4.9 mmol/L (ref 3.5–5.2)
Sodium: 138 mmol/L (ref 134–144)

## 2019-05-20 ENCOUNTER — Ambulatory Visit: Payer: Medicare Other | Admitting: Cardiology

## 2019-07-13 ENCOUNTER — Telehealth: Payer: Self-pay | Admitting: Cardiology

## 2019-07-13 DIAGNOSIS — I10 Essential (primary) hypertension: Secondary | ICD-10-CM | POA: Insufficient documentation

## 2019-07-13 DIAGNOSIS — I482 Chronic atrial fibrillation, unspecified: Secondary | ICD-10-CM | POA: Insufficient documentation

## 2019-07-13 DIAGNOSIS — I428 Other cardiomyopathies: Secondary | ICD-10-CM | POA: Insufficient documentation

## 2019-07-13 NOTE — Telephone Encounter (Signed)
Pt agreed to have Dr Percival Spanish call spouse during appt.

## 2019-07-13 NOTE — Telephone Encounter (Signed)
Patient wants to know if his wife can come to his appointment with him tomorrow. He says he is forgetful, and his wife has been at all of his appointments in the past 3 months. Please let the patient know what the office decides

## 2019-07-13 NOTE — Progress Notes (Signed)
Cardiology Office Note   Date:  07/14/2019   ID:  Mohid Furuya, DOB [birth year], MRN [id number]  PCP:  Donzetta Sprung, NP  Cardiologist:   [name], MD   Chief Complaint  Patient presents with  . Atrial Fibrillation      History of Present Illness: Jake Johnson is a 78 y.o. male who presents for follow up of atrial fib.  He also had a mildly reduced EF.  Since I last saw him he has continued to have some lower extremity swelling.  He stopped wearing compression stockings.  He eats more salt than I would like.  He denies any new shortness of breath, PND or orthopnea.  His weight is up about 13 pounds.  He is otherwise been feeling okay.  He is not noticing any palpitations, presyncope or syncope.  He has no chest pressure, neck or arm discomfort.   Past Medical History:  Diagnosis Date  . AAA (abdominal aortic aneurysm) (HCC)   . Atrial fibrillation (HCC)   . History of kidney stones   . Hypertension   . Sleep apnea    Does not use CPAP    Past Surgical History:  Procedure Laterality Date  . LAMINECTOMY WITH POSTERIOR LATERAL ARTHRODESIS LEVEL 4 N/A 04/15/2019   Procedure: Posterior Lateral Fusion with segmental instrumentation Thoracic ten-Lumbar four;  Surgeon: Tia Alert, MD;  Location: [facility];  Service: Neurosurgery;  Laterality: N/A;  . NO PAST SURGERIES    . WISDOM TOOTH EXTRACTION       Current Outpatient Medications  Medication Sig Dispense Refill  . atorvastatin (LIPITOR) 40 MG tablet Take 40 mg by mouth at bedtime.    . cefUROXime (CEFTIN) 500 MG tablet Take by mouth.    . furosemide (LASIX) 40 MG tablet Take 40 mg by mouth daily.     Marland Kitchen latanoprost (XALATAN) 0.005 % ophthalmic solution Place 1 drop into both eyes at bedtime.    . metoprolol succinate (TOPROL-XL) 50 MG 24 hr tablet Take 50 mg by mouth daily.    . Multiple Vitamin (MULTIVITAMIN WITH MINERALS) TABS tablet Take 1 tablet by mouth daily.    . polyethylene glycol (MIRALAX / GLYCOLAX)  17 g packet Take 17 g by mouth daily.    . rivaroxaban (XARELTO) 20 MG TABS tablet Take 20 mg by mouth daily.    Marland Kitchen doxycycline (VIBRAMYCIN) 100 MG capsule Take 100 mg by mouth 2 (two) times daily.    . methocarbamol (ROBAXIN) 500 MG tablet Take 1 tablet (500 mg total) by mouth every 6 (six) hours as needed for muscle spasms. 40 tablet 3  . oxyCODONE (OXY IR/ROXICODONE) 5 MG immediate release tablet Take 1-2 tablets (5-10 mg total) by mouth every 3 (three) hours as needed for moderate pain ((score 4 to 6)). (Patient not taking: Reported on 07/14/2019) 60 tablet 0   No current facility-administered medications for this visit.     Allergies:   Patient has no known allergies.    ROS:  Please see the history of present illness.   Otherwise, review of systems are positive for none.   All other systems are reviewed and negative.    PHYSICAL EXAM: VS:  BP 107/73   Pulse 69   Temp (!) 97.1 F (36.2 C)   Ht 6\' 2"  (1.88 m)   Wt 229 lb (103.9 kg)   SpO2 97%   BMI 29.40 kg/m  , BMI Body mass index is 29.4 kg/m. GENERAL:  Well appearing NECK:  No jugular venous distention, waveform within normal limits, carotid upstroke brisk and symmetric, no bruits, no thyromegaly LUNGS:  Clear to auscultation bilaterally CHEST:  Unremarkable HEART:  PMI not displaced or sustained,S1 and S2 within normal limits, no S3,  no clicks, no rubs, no murmurs, irregular  ABD:  Flat, positive bowel sounds normal in frequency in pitch, no bruits, no rebound, no guarding, no midline pulsatile mass, left greater than right lower extremity hepatomegaly, no splenomegaly EXT:  2 plus pulses throughout, no edema, no cyanosis no clubbing   EKG:  EKG is not ordered today.    Recent Labs: 04/09/2019: Hemoglobin 9.1; Platelets 188 05/07/2019: BUN 24; Creatinine, Ser 1.05; Potassium 4.9; Sodium 138    Lipid Panel No results found for: CHOL, TRIG, HDL, CHOLHDL, VLDL, LDLCALC, LDLDIRECT    Wt Readings from Last 3  Encounters:  07/14/19 229 lb (103.9 kg)  05/07/19 216 lb 6.4 oz (98.2 kg)  04/15/19 212 lb 15.4 oz (96.6 kg)      Other studies Reviewed: Additional studies/ records that were reviewed today include:None Review of the above records demonstrates:  NA.     ASSESSMENT AND PLAN:  ATRIAL FIB:    . Mr. Jake Johnson has a CHA2DS2 - VASc score of 3.  He tolerates anticoagulation and rate control.  No change in therapy.  CARDIOMYOPATHY: At this point his blood pressure is low.  I do see that he has had a negative perfusion study in the past.  I going to do a PYP scan.  Because of low blood pressures with systolics in the 97Q I cannot titrate his medications.  We talked at length about salt restriction.  HTN:   This is being managed in the context of treating his CHF currently this is the rate limiting step in managing his cardiomyopathy.  EDEMA:   This is moderate.  He is already on Lasix.  He is getting try the salt restriction and to be more adherent with this and we will see how he does with this.    Current medicines are reviewed at length with the patient today.  The patient does not have concerns regarding medicines.  The following changes have been made:  None  Labs/ tests ordered today include:     Orders Placed This Encounter  Procedures  . NM CARDIAC AMYLOID TUMOR LOC INFLAM SPECT 1 DAY     Disposition:   FU with me in 2 months.     Signed, Minus Breeding, MD  07/14/2019 4:32 PM    [facility]

## 2019-07-13 NOTE — Telephone Encounter (Signed)
Tell him that our paradigm is for me to call her on her phone while I meet with him.

## 2019-07-14 ENCOUNTER — Ambulatory Visit (INDEPENDENT_AMBULATORY_CARE_PROVIDER_SITE_OTHER): Payer: Medicare Other | Admitting: Cardiology

## 2019-07-14 ENCOUNTER — Encounter: Payer: Self-pay | Admitting: Cardiology

## 2019-07-14 ENCOUNTER — Other Ambulatory Visit: Payer: Self-pay

## 2019-07-14 VITALS — BP 107/73 | HR 69 | Temp 97.1°F | Ht 74.0 in | Wt 229.0 lb

## 2019-07-14 DIAGNOSIS — I428 Other cardiomyopathies: Secondary | ICD-10-CM | POA: Diagnosis not present

## 2019-07-14 DIAGNOSIS — I482 Chronic atrial fibrillation, unspecified: Secondary | ICD-10-CM | POA: Diagnosis not present

## 2019-07-14 DIAGNOSIS — I10 Essential (primary) hypertension: Secondary | ICD-10-CM | POA: Diagnosis not present

## 2019-07-14 NOTE — Patient Instructions (Addendum)
Medication Instructions:  Your physician recommends that you continue on your current medications as directed. Please refer to the Current Medication list given to you today.  *If you need a refill on your cardiac medications before your next appointment, please call your pharmacy*  Lab Work: none If you have labs (blood work) drawn today and your tests are completely normal, you will receive your results only by: Marland Kitchen MyChart Message (if you have MyChart) OR . A paper copy in the mail If you have any lab test that is abnormal or we need to change your treatment, we will call you to review the results.  Testing/Procedures: Your physician has requested that you have a cardiac amyloid nuclear study. TO BE SCHEDULED  Follow-Up: At [facility], you and your health needs are our priority.  As part of our continuing mission to provide you with exceptional heart care, we have created designated Provider Care Teams.  These Care Teams include your primary Cardiologist (physician) and Advanced Practice Providers (APPs -  Physician Assistants and Nurse Practitioners) who all work together to provide you with the care you need, when you need it.  Your next appointment:   2 months  The format for your next appointment:   Either In Person or Virtual  Provider:   Minus Breeding, MD

## 2019-07-22 ENCOUNTER — Telehealth (HOSPITAL_COMMUNITY): Payer: Self-pay

## 2019-07-22 NOTE — Telephone Encounter (Signed)
Encounter complete. 

## 2019-07-23 ENCOUNTER — Telehealth (HOSPITAL_COMMUNITY): Payer: Self-pay

## 2019-07-23 NOTE — Telephone Encounter (Signed)
Encounter complete. 

## 2019-07-24 ENCOUNTER — Other Ambulatory Visit: Payer: Self-pay

## 2019-07-24 ENCOUNTER — Ambulatory Visit (HOSPITAL_COMMUNITY)
Admission: RE | Admit: 2019-07-24 | Discharge: 2019-07-24 | Disposition: A | Discharge status: Home / Self Care | Payer: Medicare Other | Source: Ambulatory Visit | Attending: Internal Medicine | Admitting: Internal Medicine

## 2019-07-24 DIAGNOSIS — I428 Other cardiomyopathies: Secondary | ICD-10-CM | POA: Insufficient documentation

## 2019-07-24 MED ORDER — TECHNETIUM TC 99M PYROPHOSPHATE
20.8000 | Freq: Once | INTRAVENOUS | Status: AC
  Administered 2019-07-24: 20.8 via INTRAVENOUS

## 2019-09-12 NOTE — Progress Notes (Signed)
Cardiology Office Note   Date:  09/15/2019   ID:  Jake Johnson, DOB 1940/10/02, MRN [id number]  PCP:  Jake Handing, MD  Cardiologist:   Jake Rotunda, MD   Chief Complaint  Patient presents with  . Atrial Fibrillation      History of Present Illness: Jake Johnson is a 79 y.o. male who presents for follow up of atrial fib.  He also had a mildly reduced EF.  He had a Lexiscan Myoview negative for ischemia.  PYP was not suggestive of amyloid although technically equivocal.    Since I last saw him he has done well from a cardiovascular standpoint.  He denies any chest pressure, neck or arm discomfort.  He has had no palpitations, presyncope or syncope.  Has had no weight gain or edema.   Past Medical History:  Diagnosis Date  . AAA (abdominal aortic aneurysm) (HCC)   . Atrial fibrillation (HCC)   . History of kidney stones   . Hypertension   . Sleep apnea    Does not use CPAP    Past Surgical History:  Procedure Laterality Date  . LAMINECTOMY WITH POSTERIOR LATERAL ARTHRODESIS LEVEL 4 N/A 04/15/2019   Procedure: Posterior Lateral Fusion with segmental instrumentation Thoracic ten-Lumbar four;  Surgeon: Jake Alert, MD;  Location: [facility] OR;  Service: Neurosurgery;  Laterality: N/A;  . NO PAST SURGERIES    . WISDOM TOOTH EXTRACTION       Current Outpatient Medications  Medication Sig Dispense Refill  . atorvastatin (LIPITOR) 40 MG tablet Take 40 mg by mouth at bedtime.    . furosemide (LASIX) 40 MG tablet Take 40 mg by mouth daily.     Marland Kitchen latanoprost (XALATAN) 0.005 % ophthalmic solution Place 1 drop into both eyes at bedtime.    . metoprolol succinate (TOPROL-XL) 50 MG 24 hr tablet Take 50 mg by mouth daily.    . Multiple Vitamin (MULTIVITAMIN WITH MINERALS) TABS tablet Take 1 tablet by mouth daily.    . polyethylene glycol (MIRALAX / GLYCOLAX) 17 g packet Take 17 g by mouth daily.    . rivaroxaban (XARELTO) 20 MG TABS tablet Take 1 tablet (20 mg total) by mouth  daily. 90 tablet 3   No current facility-administered medications for this visit.    Allergies:   Patient has no known allergies.    ROS:  Please see the history of present illness.   Otherwise, review of systems are positive for none.   All other systems are reviewed and negative.    PHYSICAL EXAM: VS:  BP 106/64   Pulse (!) 56   Temp (!) 95 F (35 C)   Ht 6\' 2"  (1.88 m)   Wt 237 lb 9.6 oz (107.8 kg)   SpO2 100%   BMI 30.51 kg/m  , BMI Body mass index is 30.51 kg/m. GENERAL:  Well appearing NECK:  No jugular venous distention, waveform within normal limits, carotid upstroke brisk and symmetric, no bruits, no thyromegaly LUNGS:  Clear to auscultation bilaterally CHEST:  Unremarkable HEART:  PMI not displaced or sustained,S1 and S2 within normal limits, no S3,  no clicks, no rubs, no murmurs, irregular  ABD:  Flat, positive bowel sounds normal in frequency in pitch, no bruits, no rebound, no guarding, no midline pulsatile mass, no hepatomegaly, no splenomegaly EXT:  2 plus pulses throughout, mild bilateral ankle edema, no cyanosis no clubbing   EKG:  EKG is not ordered today.    Recent Labs: 04/09/2019:  Hemoglobin 9.1; Platelets 188 05/07/2019: BUN 24; Creatinine, Ser 1.05; Potassium 4.9; Sodium 138    Lipid Panel No results found for: CHOL, TRIG, HDL, CHOLHDL, VLDL, LDLCALC, LDLDIRECT    Wt Readings from Last 3 Encounters:  09/15/19 237 lb 9.6 oz (107.8 kg)  07/24/19 229 lb (103.9 kg)  07/14/19 229 lb (103.9 kg)      Other studies Reviewed: Additional studies/ records that were reviewed today include:  None Review of the above records demonstrates:  NA   ASSESSMENT AND PLAN:  ATRIAL FIB:    . Mr. Jake Johnson has a CHA2DS2 - VASc score of 3.  He tolerates anticoagulation.  I do see that he was very anemic in August when I reviewed hospital records.  I looked in Care Everywhere do not see any more recent labs so I will check a CBC and also a basic metabolic  profile today.   CARDIOMYOPATHY: He seems to be euvolemic.  He has no shortness of breath.  His blood pressure runs low so there is no room for med titration.  No change in therapy.   HTN:   This is being managed in the context of treating his heart failure.   EDEMA:    This is mild.  He continues with salt and fluid restriction.  No change in therapy.   COVID EDUCATION: He absolutely would not want the vaccine.  We talked about this today.  Current medicines are reviewed at length with the patient today.  The patient does not have concerns regarding medicines.  The following changes have been made:   None Labs/ tests ordered today include:     Orders Placed This Encounter  Procedures  . CBC w/Diff  . Basic metabolic panel     Disposition:   FU with me in 12  months.     Signed, Minus Breeding, MD  09/15/2019 3:15 PM    [facility]

## 2019-09-15 ENCOUNTER — Other Ambulatory Visit: Payer: Self-pay

## 2019-09-15 ENCOUNTER — Encounter (INDEPENDENT_AMBULATORY_CARE_PROVIDER_SITE_OTHER): Payer: Self-pay

## 2019-09-15 ENCOUNTER — Encounter: Payer: Self-pay | Admitting: Cardiology

## 2019-09-15 ENCOUNTER — Ambulatory Visit (INDEPENDENT_AMBULATORY_CARE_PROVIDER_SITE_OTHER): Payer: Medicare Other | Admitting: Cardiology

## 2019-09-15 VITALS — BP 106/64 | HR 56 | Temp 95.0°F | Ht 74.0 in | Wt 237.6 lb

## 2019-09-15 DIAGNOSIS — I420 Dilated cardiomyopathy: Secondary | ICD-10-CM

## 2019-09-15 DIAGNOSIS — I10 Essential (primary) hypertension: Secondary | ICD-10-CM | POA: Diagnosis not present

## 2019-09-15 DIAGNOSIS — Z5181 Encounter for therapeutic drug level monitoring: Secondary | ICD-10-CM

## 2019-09-15 DIAGNOSIS — I429 Cardiomyopathy, unspecified: Secondary | ICD-10-CM | POA: Diagnosis not present

## 2019-09-15 DIAGNOSIS — Z7189 Other specified counseling: Secondary | ICD-10-CM

## 2019-09-15 DIAGNOSIS — M7989 Other specified soft tissue disorders: Secondary | ICD-10-CM

## 2019-09-15 DIAGNOSIS — I4891 Unspecified atrial fibrillation: Secondary | ICD-10-CM

## 2019-09-15 DIAGNOSIS — I482 Chronic atrial fibrillation, unspecified: Secondary | ICD-10-CM

## 2019-09-15 MED ORDER — RIVAROXABAN 20 MG PO TABS: 20.0000 mg | ORAL_TABLET | Freq: Every day | ORAL | 3 refills | Status: DC

## 2019-09-15 NOTE — Patient Instructions (Signed)
Medication Instructions:  Your physician recommends that you continue on your current medications as directed. Please refer to the Current Medication list given to you today.  *If you need a refill on your cardiac medications before your next appointment, please call your pharmacy*  Lab Work: CBC/BMET TODAY   If you have labs (blood work) drawn today and your tests are completely normal, you will receive your results only by: Marland Kitchen MyChart Message (if you have MyChart) OR . A paper copy in the mail If you have any lab test that is abnormal or we need to change your treatment, we will call you to review the results.  Testing/Procedures: NONE  Follow-Up: At [facility], you and your health needs are our priority.  As part of our continuing mission to provide you with exceptional heart care, we have created designated Provider Care Teams.  These Care Teams include your primary Cardiologist (physician) and Advanced Practice Providers (APPs -  Physician Assistants and Nurse Practitioners) who all work together to provide you with the care you need, when you need it.  Your next appointment:   12 month(s)  The format for your next appointment:   In Person  Provider:   You may see [name], MD or one of the following Advanced Practice Providers on your designated Care Team:    Theodore Demark, PA-C  Joni Reining, DNP, ANP  Cadence Fransico Michael, NP

## 2019-09-16 LAB — BASIC METABOLIC PANEL
BUN/Creatinine Ratio: 26 — ABNORMAL HIGH (ref 10–24)
BUN: 23 mg/dL (ref 8–27)
CO2: 24 mmol/L (ref 20–29)
Calcium: 9.8 mg/dL (ref 8.6–10.2)
Chloride: 105 mmol/L (ref 96–106)
Creatinine, Ser: 0.88 mg/dL (ref 0.76–1.27)
GFR calc Af Amer: 94 mL/min/{1.73_m2} (ref >59)
GFR calc non Af Amer: 82 mL/min/{1.73_m2} (ref >59)
Glucose: 88 mg/dL (ref 65–99)
Potassium: 5 mmol/L (ref 3.5–5.2)
Sodium: 140 mmol/L (ref 134–144)

## 2019-09-16 LAB — CBC WITH DIFFERENTIAL/PLATELET
Basophils Absolute: 0 10*3/uL (ref 0.0–0.2)
Basos: 1 %
EOS (ABSOLUTE): 0.1 10*3/uL (ref 0.0–0.4)
Eos: 2 %
Hematocrit: 36.6 % — ABNORMAL LOW (ref 37.5–51.0)
Hemoglobin: 11.9 g/dL — ABNORMAL LOW (ref 13.0–17.7)
Immature Grans (Abs): 0 10*3/uL (ref 0.0–0.1)
Immature Granulocytes: 0 %
Lymphocytes Absolute: 1.8 10*3/uL (ref 0.7–3.1)
Lymphs: 28 %
MCH: 27.4 pg (ref 26.6–33.0)
MCHC: 32.5 g/dL (ref 31.5–35.7)
MCV: 84 fL (ref 79–97)
Monocytes Absolute: 0.4 10*3/uL (ref 0.1–0.9)
Monocytes: 7 %
Neutrophils Absolute: 3.9 10*3/uL (ref 1.4–7.0)
Neutrophils: 62 %
Platelets: 149 10*3/uL — ABNORMAL LOW (ref 150–450)
RBC: 4.35 x10E6/uL (ref 4.14–5.80)
RDW: 13.6 % (ref 11.6–15.4)
WBC: 6.2 10*3/uL (ref 3.4–10.8)

## 2019-09-18 ENCOUNTER — Telehealth: Payer: Self-pay | Admitting: *Deleted

## 2019-09-18 NOTE — Telephone Encounter (Signed)
Patient with diagnosis of atrial fibrillation on Xarelto for anticoagulation.    Procedure: colonoscopy Date of procedure: 10/13/19  CHADS2-VASc score of  4 (CHF, HTN, AGE x 2,   CrCl 103.8 Platelet count 149  Per office protocol, patient can hold Xarelto for 1 day prior to procedure.

## 2019-09-18 NOTE — Telephone Encounter (Signed)
   [facility] Pre-operative Risk Assessment    Request for surgical clearance:  1. What type of surgery is being performed? COLONOSCOPY  2. When is this surgery scheduled?  10/13/19   3. What type of clearance is required (medical clearance vs. Pharmacy clearance to hold med vs. Both)? BOTH  4. Are there any medications that need to be held prior to surgery and how long? [place]  5. Practice name and name of physician performing surgery?  [facility]  6. What is your office phone number [phone]    7.   What is your office fax number [phone]  8.   Anesthesia type (None, local, MAC, general) ?  PROPOFOL   Jake Johnson 09/18/2019, 10:17 AM  _________________________________________________________________   (provider comments below)

## 2019-09-20 NOTE — Telephone Encounter (Signed)
OK for planned procedure

## 2019-09-21 NOTE — Telephone Encounter (Signed)
   Primary Cardiologist: [name], MD  Chart reviewed as part of pre-operative protocol coverage. Patient recently seen by Dr. Antoine Poche on 09/15/2019 and was doing well from a cardiac standpoint at that time. Per Dr. Antoine Poche, patient is "OK for planned procedure" without further cardiovascular testing.    Per Pharmacy and our office protocol, "patient can hold Xarelto for 1 day prior to procedure."    I will route this recommendation to the requesting party via Epic fax function and remove from pre-op pool.  Please call with questions.  Corrin Parker, PA-C 09/21/2019, 7:55 AM

## 2020-04-07 IMAGING — CT CT LUMBAR SPINE WITHOUT CONTRAST
3 of 4 series · 13 of 33 positions shown, 16 images · non-contrast
Comparison: Lumbar spine MRI dated 02/18/2019

CLINICAL DATA: 78-year-old male with low back pain and numbness
since December 2018. History of obstructive uropathy and sepsis.

EXAM:
CT LUMBAR SPINE WITHOUT CONTRAST
TECHNIQUE: Multidetector CT imaging of the lumbar spine was performed without
intravenous contrast administration. Multiplanar CT image
reconstructions were also generated.

[Series 3: l-spine 2.00 br40 s3 lspine st · axial · 0.40mm/px · z∈[-1152,-1000]mm · 5 of 114 slices shown, 7 images]
[im 19/114  soft-tissue]
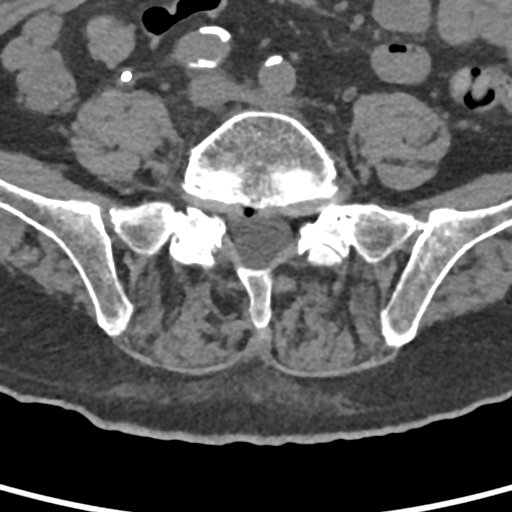
[im 19/114  bone]
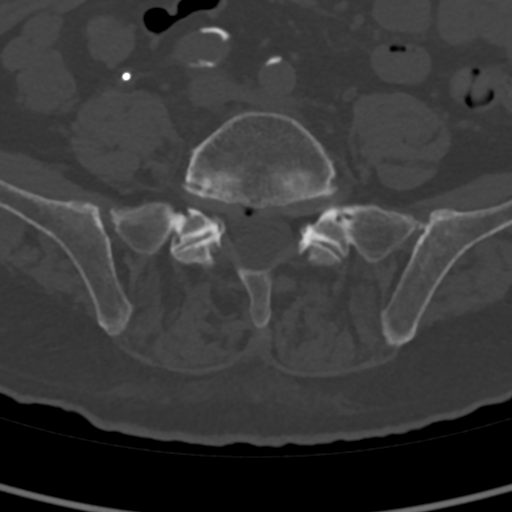
[im 38/114  bone]
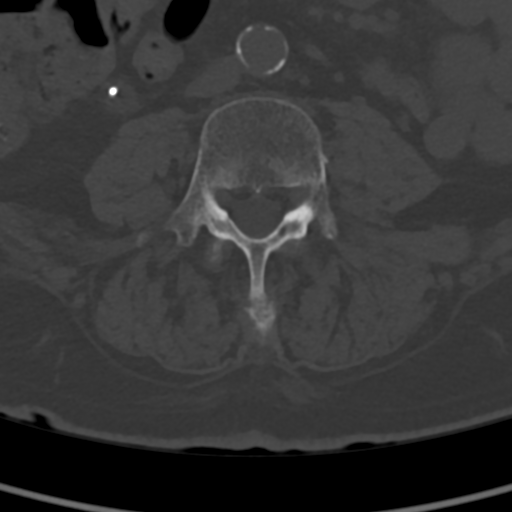
[im 57/114  bone]
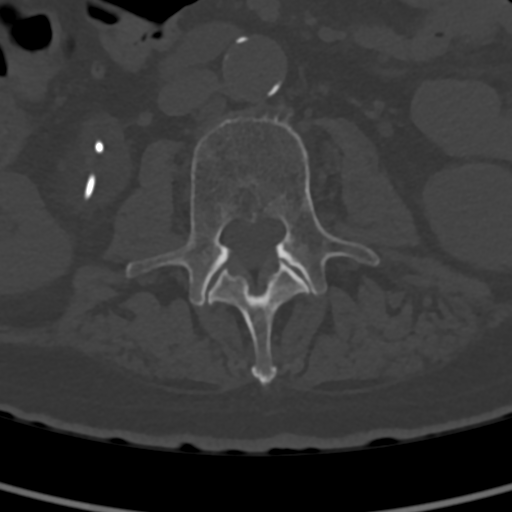
[im 76/114  bone]
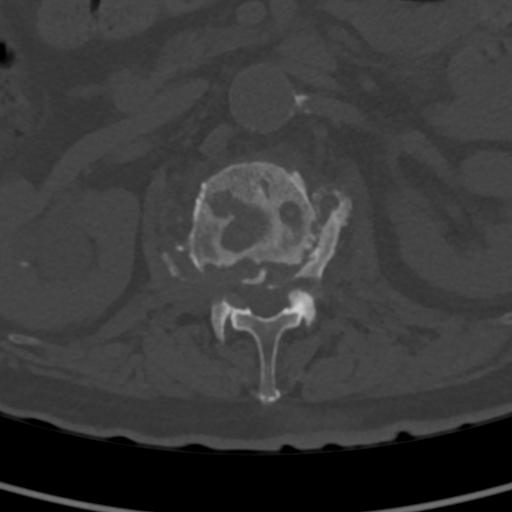
[im 95/114  soft-tissue]
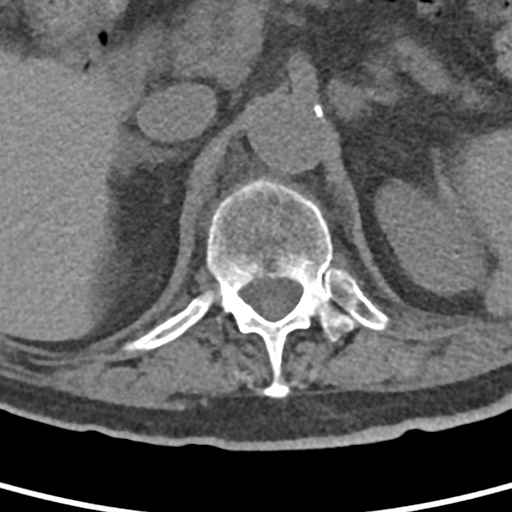
[im 95/114  bone]
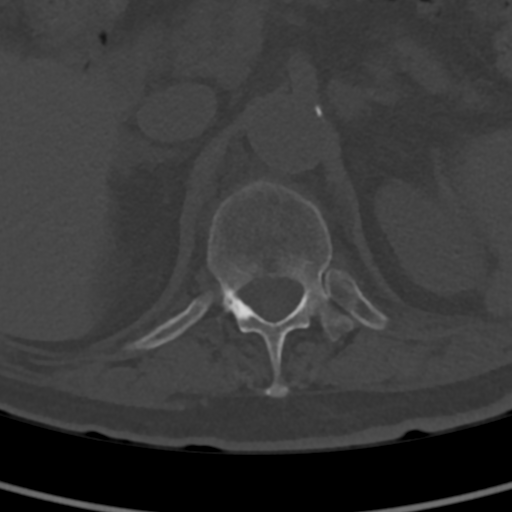

[Series 5: l-spine 2.00 br60 s3 sag bone · sagittal · 0.36mm/px · 5 of 88 slices shown, 6 images]
[im 30/88  bone]
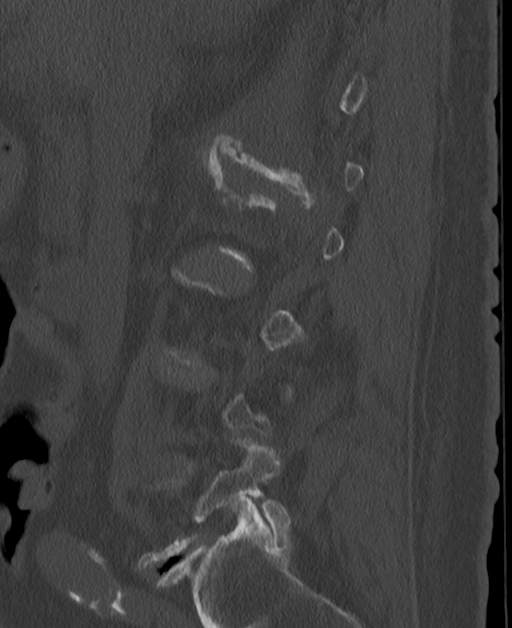
[im 37/88  bone]
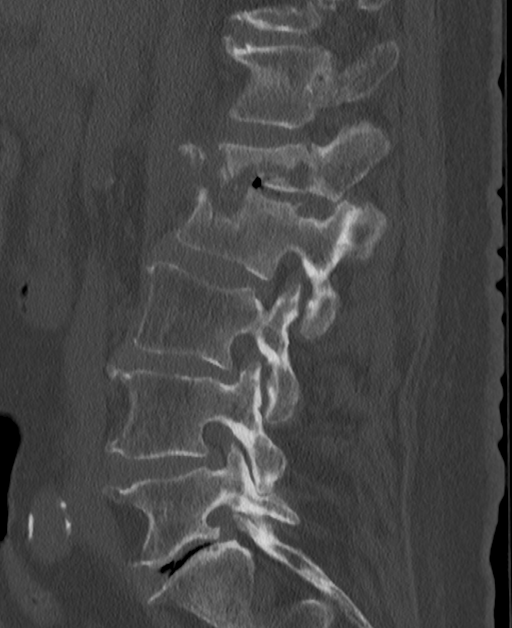
[im 44/88  soft-tissue]
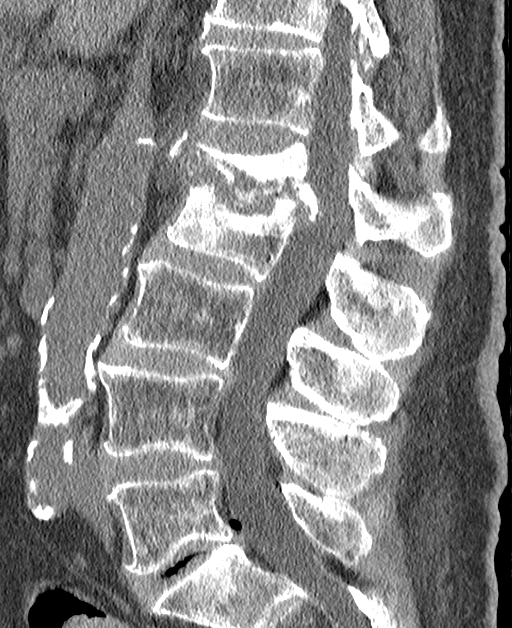
[im 44/88  bone]
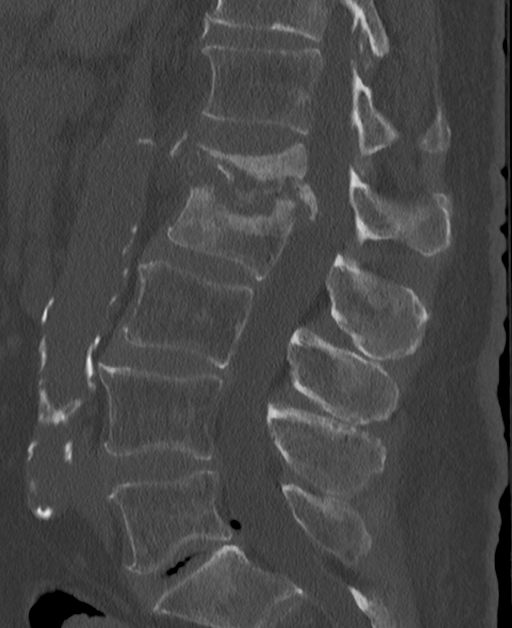
[im 51/88  bone]
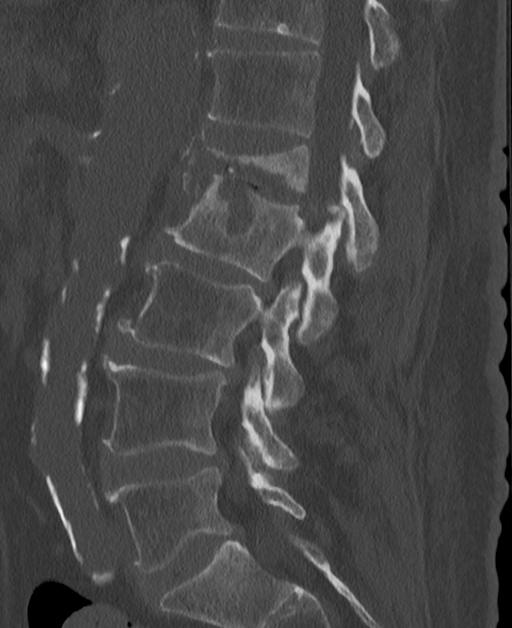
[im 59/88  bone]
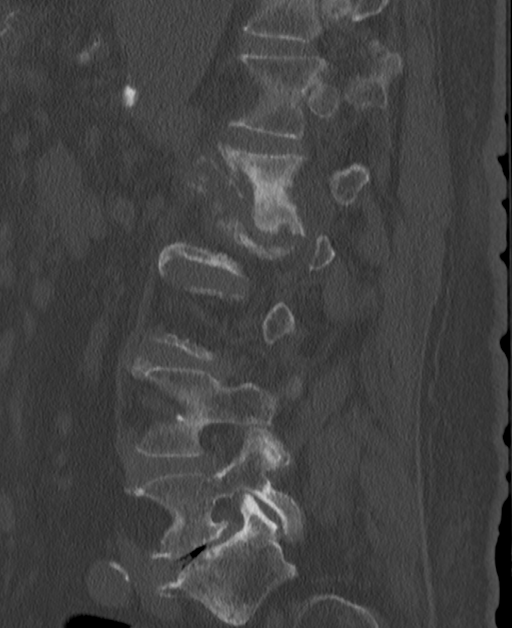

[Series 7: l-spine 2.00 br60 s3 cor bone · coronal · 0.35mm/px · 3 of 91 slices shown]
[im 19/91  bone]
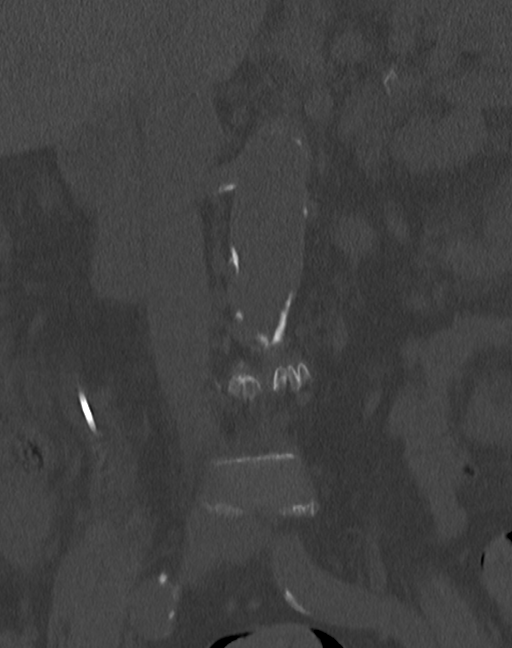
[im 37/91  bone]
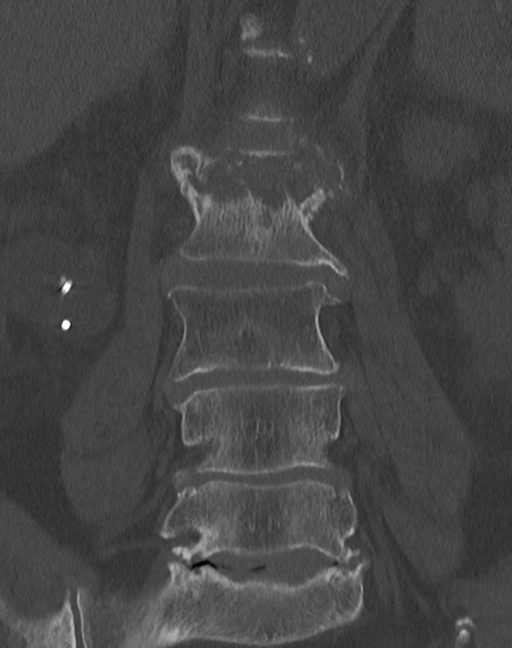
[im 55/91  bone]
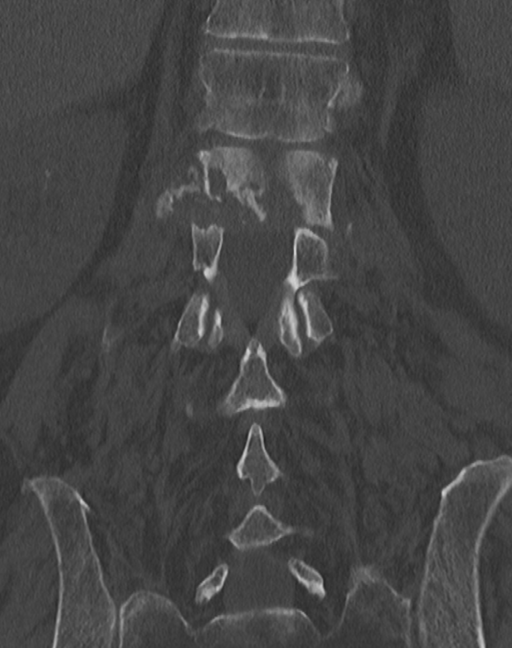

[13 of 33 positions shown; findings below may reference images not displayed]

FINDINGS: Segmentation: 5 lumbar type vertebrae.

Alignment: No acute subluxation.

Vertebrae: There is destructive changes of the majority of the L1
vertebra with vertebral body collapse. There is destruction of the
lower endplate of L1 and superior endplate of L2 as seen on the
prior MRI most consistent with sequela of spondylodiscitis. There is
extension of the bone destruction to the superior endplate of L1.
There is destructive changes of the anterior L1 vertebral body with
buckling of the cortex. There is approximately 12 mm retropulsion of
bone and soft tissues at the level of L1-L2 with associated moderate
focal narrowing of the central canal. These findings are similar to
the MRI of 02/18/2019. No new/acute fractures identified. The bones
are osteopenic.

Paraspinal and other soft tissues: Paravertebral inflammatory
changes and soft tissue thickening at L1 and L2. No discrete
drainable fluid collection identified on this noncontrast CT. There
is advanced aortoiliac atherosclerotic disease. There is a 2.8 cm
infrarenal aortic ectasia. There are multiple nonobstructing right
renal calculi. There is a moderate right hydronephrosis. Partially
visualized right ureteral stent with proximal pigtail tip in the
region of the ureteropelvic junction. There is a 3 mm nonobstructing
stone in the midportion of the right ureter.

Disc levels: Destructive changes of L1-L2 disc as described and
similar to prior MRI sequela of spondylo-discitis. Associated
narrowing of the L1-L2 neural foramina bilaterally secondary to
retropulsed disc/bone. There are additional multilevel diffuse disc
bulge including diffuse disc bulge at L3-L4 with associated
bilateral neural foramina narrowing. There is disc desiccation and
vacuum phenomena at L5-S1.
IMPRESSION: 1. Destructive changes of the majority of the L1 vertebra with
vertebral body collapse, as seen on the prior MRI, most consistent
with sequela of spondylo-discitis. There is approximately 12 mm
retropulsion of bone and soft tissues at the level of L1-L2 with
associated moderate focal narrowing of the central canal. These
findings are similar to the MRI of 02/18/2019.
[DATE]. No new/acute fractures identified.
3. Multilevel degenerative disc disease including diffuse disc bulge
at L3-L4 with associated bilateral neural foramina narrowing.
4. Osteopenia.
5. Multiple nonobstructing right renal calculi as well as a
nonobstructing stone in the mid right ureter. Moderate right
hydronephrosis. Partially visualized right ureteral stent with
proximal tip in the region of the right UPJ.
6.  Aortic Atherosclerosis ([id number]).

## 2020-05-05 IMAGING — RF LUMBAR SPINE - 2-3 VIEW
1 series · 6 of 6 positions shown · non-contrast
Comparison: CT thoracic and lumbar spine 03/18/2019

FLUOROSCOPY TIME:  2 minutes 39 seconds

Images obtained: 6

CLINICAL DATA: T10-L4 instrumentation

EXAM:
LUMBAR SPINE - 2-3 VIEW; DG C-ARM 61-120 MIN

[Series 1: run · 6 of 6 slices shown]
[im 1/6]
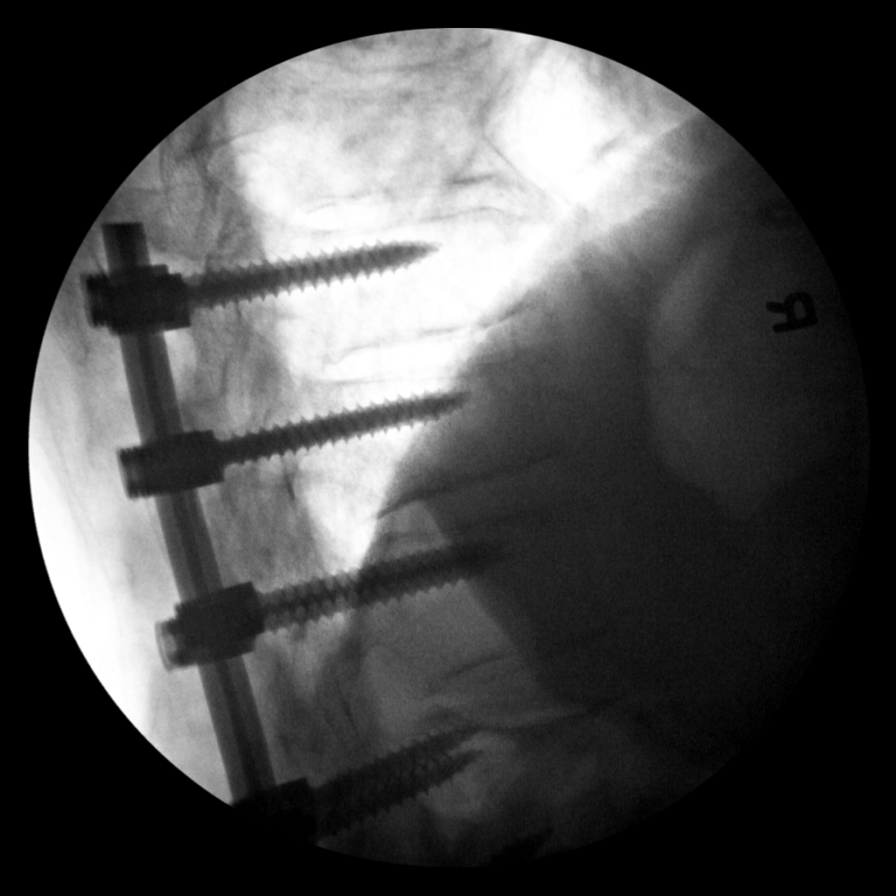
[im 2/6]
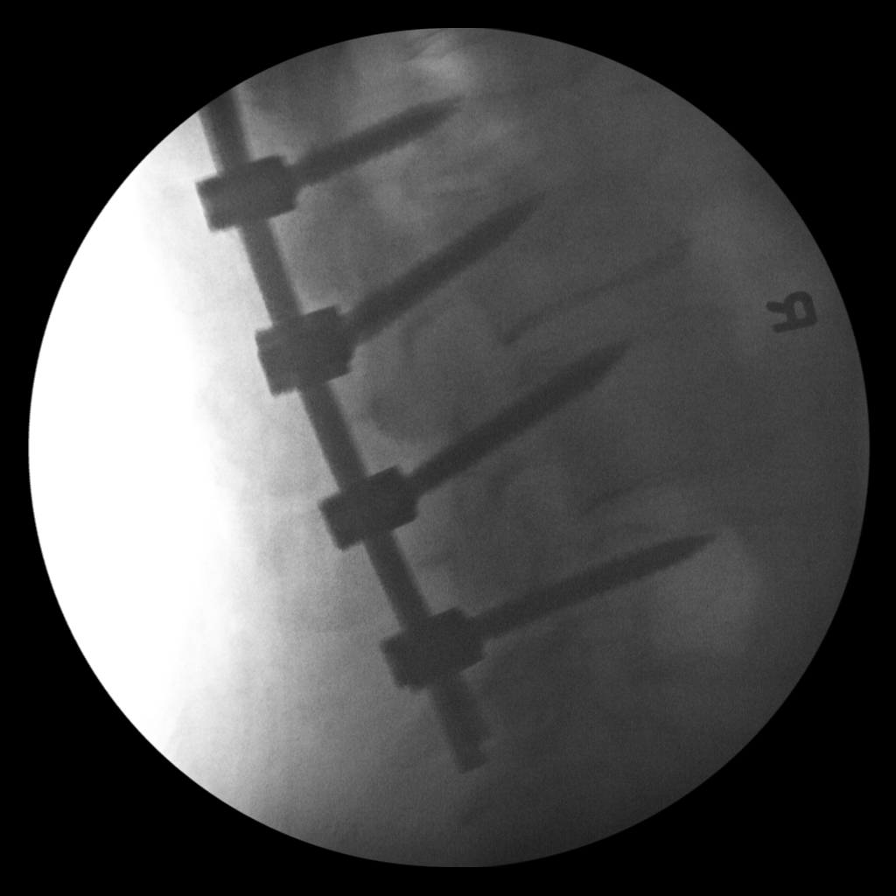
[im 3/6]
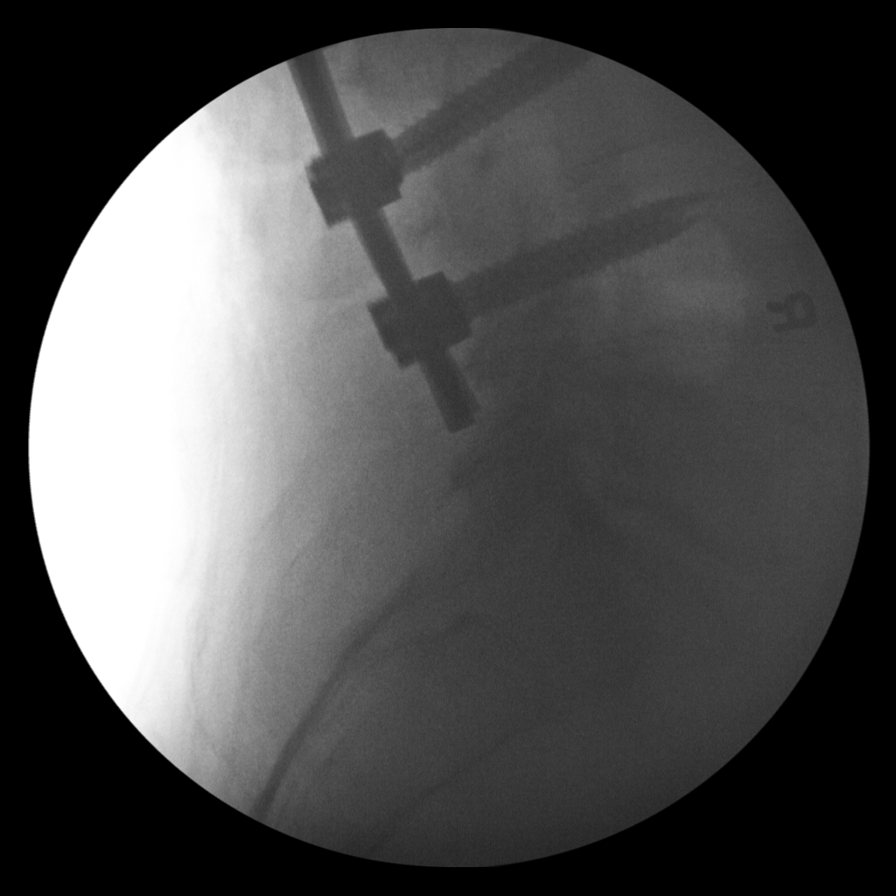
[im 4/6]
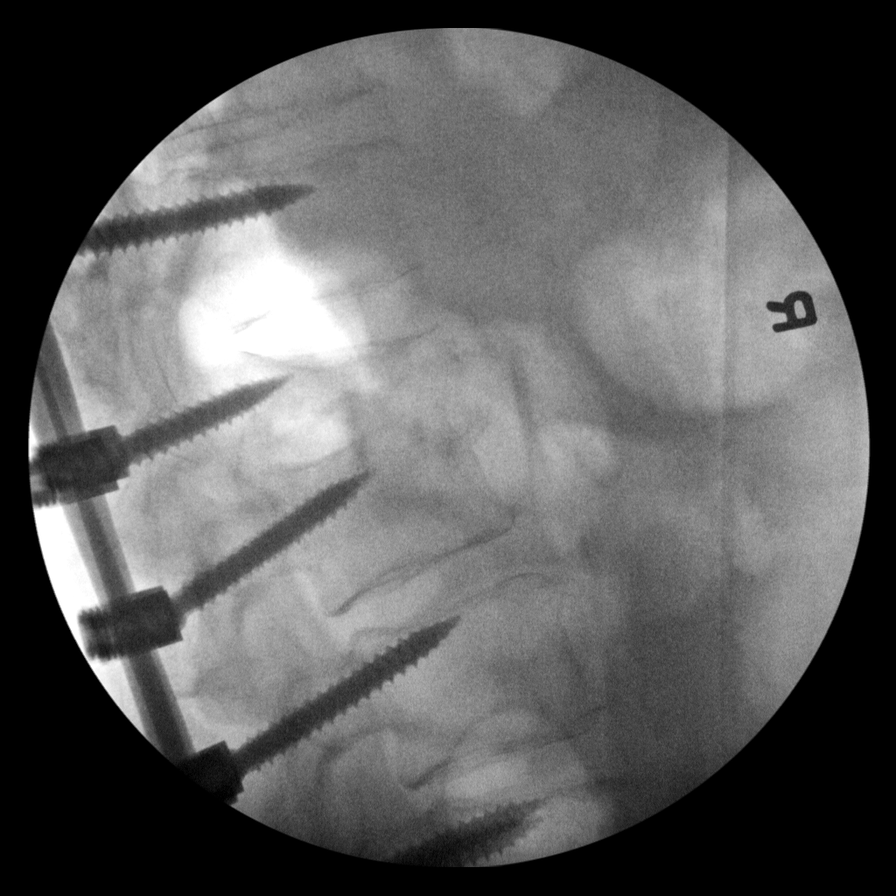
[im 5/6]
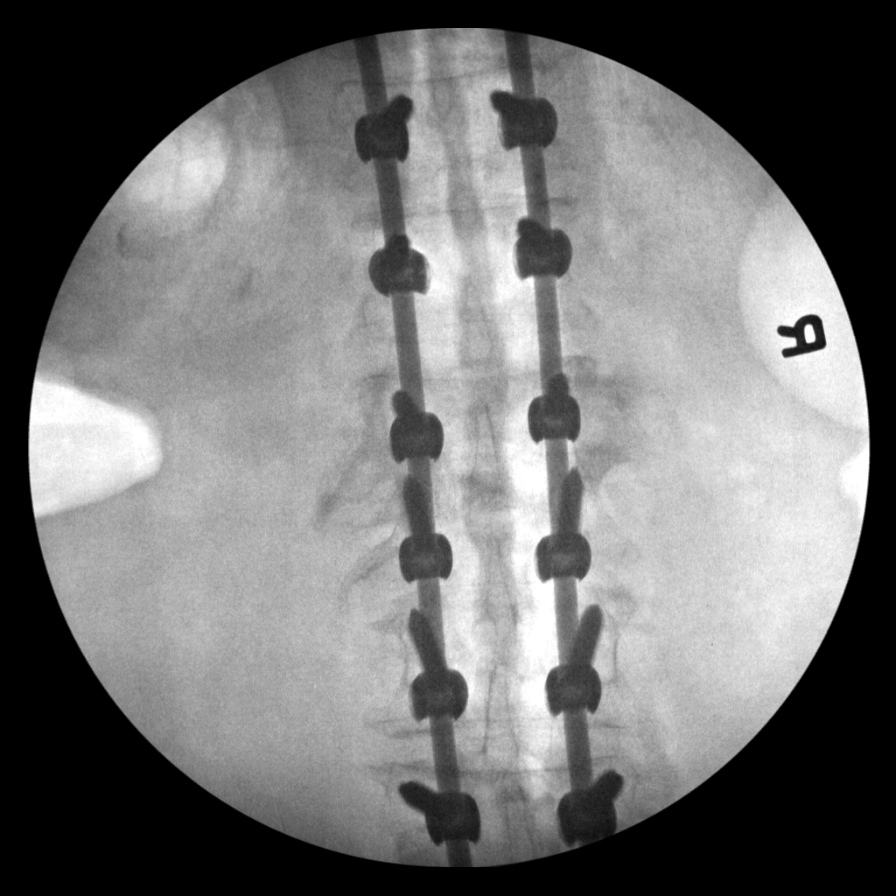
[im 6/6]
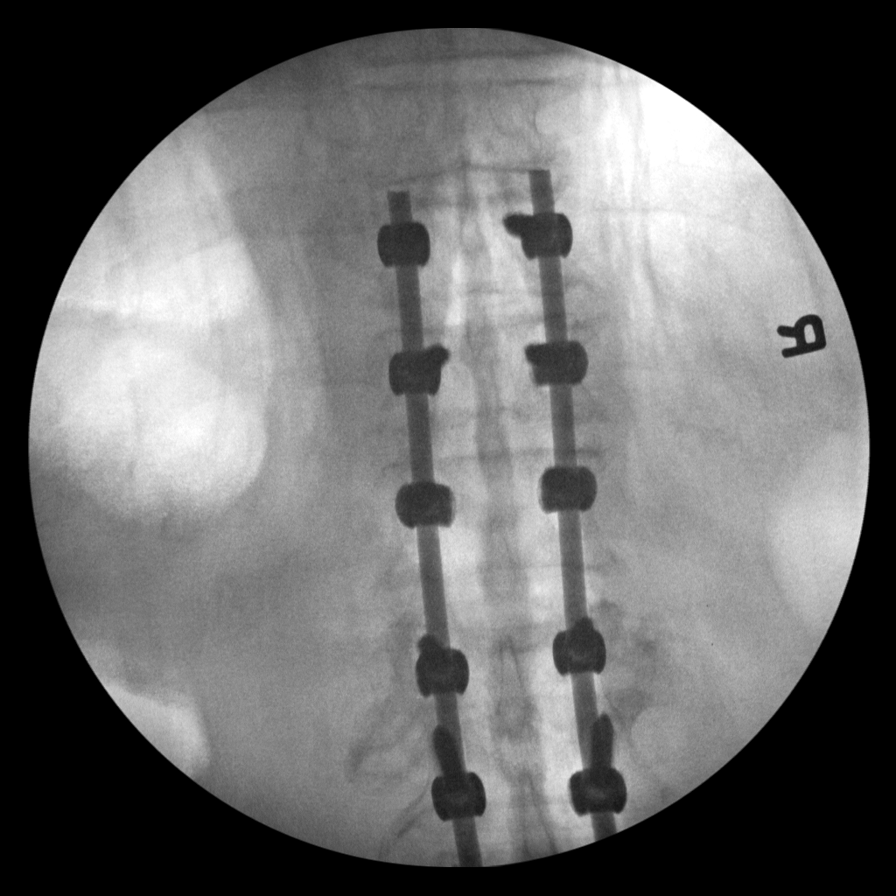

[6 of 6 positions shown; findings below may reference images not displayed]

FINDINGS: Osseous demineralization.

Prior CT demonstrates marked compression deformity of the L1
vertebral body.

BILATERAL pedicle screws and posterior bars are identified from T10
to L4.

Marked compression fracture of L1 vertebral body again seen.

Remaining vertebral bodies appear normal in alignment.

Subtle superior endplate deformity of T11 with height loss is noted.
IMPRESSION: Posterior fusion T10-L4.

Marked compression fracture of L1 vertebral body.

## 2020-11-16 ENCOUNTER — Other Ambulatory Visit: Payer: Self-pay | Admitting: Cardiology

## 2020-11-16 NOTE — Telephone Encounter (Signed)
52m, 107.8kg, scr 0.98 07/18/20, ccr , lovw/hochrein 09/15/19

## 2021-03-17 ENCOUNTER — Telehealth: Payer: Self-pay | Admitting: Cardiology

## 2021-03-17 MED ORDER — RIVAROXABAN 20 MG PO TABS: 20.0000 mg | ORAL_TABLET | Freq: Every day | ORAL | 1 refills | Status: DC

## 2021-03-17 NOTE — Telephone Encounter (Signed)
46M, 107.8 kg, LOVW/HOCHREIN 09/15/19 (OVERDUE), CCR 90.1 Creatinine 07/18/20 0.50 - 1.50 mg/dL 5.62

## 2021-03-17 NOTE — Telephone Encounter (Signed)
*  STAT* If patient is at the pharmacy, call can be transferred to refill team.   1. Which medications need to be refilled? (please list name of each medication and dose if known) rivaroxaban (XARELTO) 20 MG TABS tablet  2. Which pharmacy/location (including street and city if local pharmacy) is medication to be sent to? [id number] - [place], [place] - [address]  3. Do they need a 30 day or 90 day supply? 90 day supply  Pt is completely out of this medicine

## 2021-05-23 DIAGNOSIS — M7989 Other specified soft tissue disorders: Secondary | ICD-10-CM | POA: Insufficient documentation

## 2021-05-23 NOTE — Progress Notes (Signed)
Cardiology Office Note   Date:  05/24/2021   ID:  Jake Johnson, DOB 11/23/1940, MRN [id number]  PCP:  Dalphine Handing, MD  Cardiologist:   [name], MD   Chief Complaint  Patient presents with   Atrial Fibrillation       History of Present Illness: Jake Johnson is a 80 y.o. male who presents for follow up of atrial fib.  He also had a mildly reduced EF.  He had a Lexiscan Myoview negative for ischemia.  PYP was not suggestive of amyloid although technically equivocal.    Since I last saw him he has done well.  He does have frequent urination but he says he drinks at least 60 ounces of water a day and he takes a diuretic. The patient denies any new symptoms such as chest discomfort, neck or arm discomfort. There has been no new shortness of breath, PND or orthopnea. There have been no reported palpitations, presyncope or syncope.  He does not notice that he is in atrial fibrillation.  Past Medical History:  Diagnosis Date   AAA (abdominal aortic aneurysm) (HCC)    Atrial fibrillation (HCC)    History of kidney stones    Hypertension    Sleep apnea    Does not use CPAP    Past Surgical History:  Procedure Laterality Date   LAMINECTOMY WITH POSTERIOR LATERAL ARTHRODESIS LEVEL 4 N/A 04/15/2019   Procedure: Posterior Lateral Fusion with segmental instrumentation Thoracic ten-Lumbar four;  Surgeon: Tia Alert, MD;  Location: [facility] OR;  Service: Neurosurgery;  Laterality: N/A;   NO PAST SURGERIES     WISDOM TOOTH EXTRACTION       Current Outpatient Medications  Medication Sig Dispense Refill   atorvastatin (LIPITOR) 40 MG tablet Take 40 mg by mouth at bedtime.     furosemide (LASIX) 40 MG tablet Take 40 mg by mouth daily.      latanoprost (XALATAN) 0.005 % ophthalmic solution Place 1 drop into both eyes at bedtime.     metoprolol succinate (TOPROL-XL) 50 MG 24 hr tablet Take 50 mg by mouth daily.     Multiple Vitamin (MULTIVITAMIN WITH MINERALS) TABS tablet Take 1  tablet by mouth daily.     polyethylene glycol (MIRALAX / GLYCOLAX) 17 g packet Take 17 g by mouth daily.     rivaroxaban (XARELTO) 20 MG TABS tablet Take 1 tablet (20 mg total) by mouth daily with supper. Needs appointment with cardiologist for further refills 90 tablet 1   No current facility-administered medications for this visit.    Allergies:   Patient has no known allergies.    ROS:  Please see the history of present illness.   Otherwise, review of systems are positive for none.   All other systems are reviewed and negative.    PHYSICAL EXAM: VS:  BP 120/72   Pulse 67   Ht 5\' 11"  (1.803 m)   Wt 244 lb 3.2 oz (110.8 kg)   SpO2 96%   BMI 34.06 kg/m  , BMI Body mass index is 34.06 kg/m. GENERAL:  Well appearing NECK:  No jugular venous distention, waveform within normal limits, carotid upstroke brisk and symmetric, no bruits, no thyromegaly LUNGS:  Clear to auscultation bilaterally CHEST:  Unremarkable HEART:  PMI not displaced or sustained,S1 and S2 within normal limits, no S3, no clicks, no rubs, no murmurs, irregular ABD:  Flat, positive bowel sounds normal in frequency in pitch, no bruits, no rebound, no guarding, no  midline pulsatile mass, no hepatomegaly, no splenomegaly EXT:  2 plus pulses throughout, trace ankle edema, no cyanosis no clubbing  EKG:  EKG is  ordered today. Atrial fibrillation, rate 67, right bundle branch block, left anterior fascicular block   Recent Labs: No results found for requested labs within last 8760 hours.    Lipid Panel No results found for: CHOL, TRIG, HDL, CHOLHDL, VLDL, LDLCALC, LDLDIRECT    Wt Readings from Last 3 Encounters:  05/24/21 244 lb 3.2 oz (110.8 kg)  09/15/19 237 lb 9.6 oz (107.8 kg)  07/24/19 229 lb (103.9 kg)      Other studies Reviewed: Additional studies/ records that were reviewed today include:  None Review of the above records demonstrates:  NA    ASSESSMENT AND PLAN:  ATRIAL FIB:    . Jake Johnson has a CHA2DS2 - VASc score of 3.   He tolerates anticoagulation.  He has good rate control.  He is up-to-date with labs with normal hemoglobin in November.  No change in therapy is indicated.   He is on the appropriate dose.   CARDIOMYOPATHY:   He has had mildly reduced ejection fraction.  It was stable on echo 2 years ago.  He has no symptoms.  No change in therapy.  Of note he has had hypotension in the past and has not tolerated med titration.  HTN:   This is being managed in the context of treating his reduced ejection fraction.  No change in therapy.   CONDUCTION ABNORMALITY: He has an abnormal EKG but no symptoms related to bradycardia arrhythmias.  No change in therapy.    EDEMA:   This seems to be unchanged and we did talk about reducing his water intake.    Current medicines are reviewed at length with the patient today.  The patient does not have concerns regarding medicines.  The following changes have been made:   None Labs/ tests ordered today include:    None  Orders Placed This Encounter  Procedures   EKG 12-Lead      Disposition:   FU with me in 18  months.     Signed, [name], MD  05/24/2021 1:19 PM    [facility]

## 2021-05-24 ENCOUNTER — Encounter: Payer: Self-pay | Admitting: Cardiology

## 2021-05-24 ENCOUNTER — Other Ambulatory Visit: Payer: Self-pay

## 2021-05-24 ENCOUNTER — Ambulatory Visit (INDEPENDENT_AMBULATORY_CARE_PROVIDER_SITE_OTHER): Payer: Medicare Other | Admitting: Cardiology

## 2021-05-24 VITALS — BP 120/72 | HR 67 | Ht 71.0 in | Wt 244.2 lb

## 2021-05-24 DIAGNOSIS — I10 Essential (primary) hypertension: Secondary | ICD-10-CM | POA: Diagnosis not present

## 2021-05-24 DIAGNOSIS — M7989 Other specified soft tissue disorders: Secondary | ICD-10-CM

## 2021-05-24 DIAGNOSIS — I482 Chronic atrial fibrillation, unspecified: Secondary | ICD-10-CM | POA: Diagnosis not present

## 2021-05-24 DIAGNOSIS — I428 Other cardiomyopathies: Secondary | ICD-10-CM | POA: Diagnosis not present

## 2021-05-24 NOTE — Patient Instructions (Signed)
Medication Instructions:  The current medical regimen is effective;  continue present plan and medications.  *If you need a refill on your cardiac medications before your next appointment, please call your pharmacy*   Follow-Up: At [facility], you and your health needs are our priority.  As part of our continuing mission to provide you with exceptional heart care, we have created designated Provider Care Teams.  These Care Teams include your primary Cardiologist (physician) and Advanced Practice Providers (APPs -  Physician Assistants and Nurse Practitioners) who all work together to provide you with the care you need, when you need it.  We recommend signing up for the patient portal called "MyChart".  Sign up information is provided on this After Visit Summary.  MyChart is used to connect with patients for Virtual Visits (Telemedicine).  Patients are able to view lab/test results, encounter notes, upcoming appointments, etc.  Non-urgent messages can be sent to your provider as well.   To learn more about what you can do with MyChart, go to [email or web address].    Your next appointment:   18 month(s)  The format for your next appointment:   In Person  Provider:   James Hochrein, MD    

## 2022-04-04 NOTE — Progress Notes (Unsigned)
Cardiology Office Note:    Date:  04/05/2022   ID:  Jake Johnson, DOB 02/26/1941, MRN [id number]  PCP:  Jake Handing, MD [facility] Cardiologist: Jake Rotunda, MD   Reason for visit: Follow-up  History of Present Illness:    Jake Johnson is a 81 y.o. male with a hx of atrial fibrillation, sleep apnea, hypertension and systolic heart failure with a EF 40 to 45% on echo in 2020.  He had a Lexiscan Myoview negative for ischemia.  PYP was not suggestive of amyloid although technically equivocal.  Patient mentions in 2020 he did not feel well and he had a kidney infection with kidney stone leading to sepsis.  He states this infection led to deterioration of his lumbar spine status post rod placement.  Also at this time, he had a new diagnosis of A-fib.  He last saw Dr. Antoine Johnson in September 2022.  Patient denied heart failure symptoms and palpitations.  [facility]  He mentioned that work-up for new central sleep apnea can include echo to evaluate heart function.  Patient states he started CPAP in 2012 but started stopped in late 2010 secondary to noise machine keeping his wife awake.  He had a repeat sleep study showing that he has new central sleep apnea which he would require a different machine for.  Today, he is doing pretty well overall.  He used to go to [employer] 3 times a week but states he has been busy recently.  He has not been as active as secondary to the heat.  He he denies palpitations, lightheadedness, syncope, vision changes, significant edema, chest pain, shortness of breath, PND and orthopnea.  His weight is the same since last year.  He does mention that he tries stay hydrated while on Lasix and he has to urinate 10-12 times per day.    Past Medical History:  Diagnosis Date   AAA (abdominal aortic aneurysm) (HCC)    Atrial fibrillation (HCC)    History of kidney stones    Hypertension     Sleep apnea    Does not use CPAP    Past Surgical History:  Procedure Laterality Date   LAMINECTOMY WITH POSTERIOR LATERAL ARTHRODESIS LEVEL 4 N/A 04/15/2019   Procedure: Posterior Lateral Fusion with segmental instrumentation Thoracic ten-Lumbar four;  Surgeon: Jake Alert, MD;  Location: [facility] OR;  Service: Neurosurgery;  Laterality: N/A;   NO PAST SURGERIES     WISDOM TOOTH EXTRACTION      Current Medications: Current Meds  Medication Sig   atorvastatin (LIPITOR) 40 MG tablet Take 40 mg by mouth at bedtime.   furosemide (LASIX) 20 MG tablet Take 1 tablet (20 mg total) by mouth daily. Can Take Additional Tablet for Swelling and Edema   latanoprost (XALATAN) 0.005 % ophthalmic solution Place 1 drop into both eyes at bedtime.   metoprolol succinate (TOPROL XL) 25 MG 24 hr tablet Take 0.5 tablets (12.5 mg total) by mouth daily.   Multiple Vitamin (MULTIVITAMIN WITH MINERALS) TABS tablet Take 1 tablet by mouth daily.   polyethylene glycol (MIRALAX / GLYCOLAX) 17 g packet Take 17 g by mouth daily.   rivaroxaban (XARELTO) 20 MG TABS tablet Take 1 tablet (20 mg total) by mouth daily with supper. Needs appointment with cardiologist for further refills   [DISCONTINUED] furosemide (LASIX) 40 MG tablet Take 40 mg by mouth daily.    [DISCONTINUED] metoprolol succinate (TOPROL-XL) 50 MG  24 hr tablet Take 50 mg by mouth daily.     Allergies:   Patient has no known allergies.   Social History   Socioeconomic History   Marital status: Married    Spouse name: Not on file   Number of children: Not on file   Years of education: Not on file   Highest education level: Not on file  Occupational History   Not on file  Tobacco Use   Smoking status: Never   Smokeless tobacco: Never  Vaping Use   Vaping Use: Never used  Substance and Sexual Activity   Alcohol use: Never   Drug use: Never   Sexual activity: Not on file  Other Topics Concern   Not on file  Social History Narrative   Not on file    Social Determinants of Health   Financial Resource Strain: Not on file  Food Insecurity: Not on file  Transportation Needs: Not on file  Physical Activity: Not on file  Stress: Not on file  Social Connections: Not on file     Family History: The patient's family history includes Heart disease in his father.  ROS:   Please see the history of present illness.     EKGs/Labs/Other Studies Reviewed:    EKG:  The ekg ordered today demonstrates atrial fibrillation with slow ventricular response, right bundle branch block, left anterior fascicular block, heart rate 47.  QRS 164 ms.  Recent Labs: No results found for requested labs within last 365 days.   Recent Lipid Panel No results found for: "CHOL", "TRIG", "HDL", "LDLCALC", "LDLDIRECT"  Physical Exam:    VS:  BP 118/66   Pulse (!) 47   Ht 5\' 11"  (1.803 m)   Wt 244 lb (110.7 kg)   SpO2 97%   BMI 34.03 kg/m    No data found.       Wt Readings from Last 3 Encounters:  04/05/22 244 lb (110.7 kg)  05/24/21 244 lb 3.2 oz (110.8 kg)  09/15/19 237 lb 9.6 oz (107.8 kg)     GEN:  Well nourished, well developed in no acute distress HEENT: Normal NECK: No JVD; No carotid bruits CARDIAC: Irregular rhythm, brady, no murmurs, rubs, gallops RESPIRATORY:  Clear to auscultation without rales, wheezing or rhonchi  ABDOMEN: Soft, non-tender, non-distended MUSCULOSKELETAL: No edema; No deformity  SKIN: Warm and dry NEUROLOGIC:  Johnson and oriented PSYCHIATRIC:  Normal affect     ASSESSMENT AND PLAN   Nonischemic cardiomyopathy, euvolemic -EF 40-45% on echo in 2020 -Repeat 2D echo with new central sleep apnea -Secondary to frequent urination, will trial decreasing Lasix to 20 mg daily.  Instructed to take extra 20 mg tablet for lower extremity swelling or shortness of breath.  Atrial fibrillation with slow ventricular response -Now with bifascicular block and heart rate 47, recommend decreasing Toprol from 50 mg daily to  12.5 mg daily.  Follow-up in 3 months with repeat EKG - may be able to stop Toprol entirely -Continue anticoagulation for stroke prevention -he denies significant bleeding issues   Bifascicular block -Denies lightheadedness and syncope. -Decrease Toprol as above. -Clinically monitor.  Hyperlipidemia  -Followed by PCP; LDL 30 in November 2022.  Continue Lipitor.  Sleep apnea -Followed by sleep medicine.  Disposition - Follow-up in 3 months with Dr. December 2022 or myself.   Medication Adjustments/Labs and Tests Ordered: Current medicines are reviewed at length with the patient today.  Concerns regarding medicines are outlined above.  Orders Placed This Encounter  Procedures  EKG 12-Lead   ECHOCARDIOGRAM COMPLETE   Meds ordered this encounter  Medications   furosemide (LASIX) 20 MG tablet    Sig: Take 1 tablet (20 mg total) by mouth daily. Can Take Additional Tablet for Swelling and Edema    Dispense:  90 tablet    Refill:  3   metoprolol succinate (TOPROL XL) 25 MG 24 hr tablet    Sig: Take 0.5 tablets (12.5 mg total) by mouth daily.    Dispense:  45 tablet    Refill:  3    Patient Instructions  Medication Instructions:  Decrease Metoprolol Succinate to 12.5 mg ( Take Half of 25 mg Tablet Daily). Decrease Lasix to 20 mg ( Take 1 Tablet Daily). *If you need a refill on your cardiac medications before your next appointment, please call your pharmacy*   Lab Work: No Labs If you have labs (blood work) drawn today and your tests are completely normal, you will receive your results only by: MyChart Message (if you have MyChart) OR A paper copy in the mail If you have any lab test that is abnormal or we need to change your treatment, we will call you to review the results.   Testing/Procedures: [address]. Your physician has requested that you have an echocardiogram. Echocardiography is a painless test that uses sound waves to create images of your  heart. It provides your doctor with information about the size and shape of your heart and how well your heart's chambers and valves are working. This procedure takes approximately one hour. There are no restrictions for this procedure.    Follow-Up: At [facility], you and your health needs are our priority.  As part of our continuing mission to provide you with exceptional heart care, we have created designated Provider Care Teams.  These Care Teams include your primary Cardiologist (physician) and Advanced Practice Providers (APPs -  Physician Assistants and Nurse Practitioners) who all work together to provide you with the care you need, when you need it.  We recommend signing up for the patient portal called "MyChart".  Sign up information is provided on this After Visit Summary.  MyChart is used to connect with patients for Virtual Visits (Telemedicine).  Patients are able to view lab/test results, encounter notes, upcoming appointments, etc.  Non-urgent messages can be sent to your provider as well.   To learn more about what you can do with MyChart, go to [email or web address].    Your next appointment:   3 month(s)  The format for your next appointment:   In Person  Provider:   [name], MD     Other Instructions Monitor Blood Pressure and Heart Rate Log for 3 weeks send via MyChart message.  Important Information About Sugar         Signed, Bernette Mayers  04/05/2022 9:10 AM    [facility]

## 2022-04-05 ENCOUNTER — Encounter: Payer: Self-pay | Admitting: Physician Assistant

## 2022-04-05 ENCOUNTER — Ambulatory Visit (INDEPENDENT_AMBULATORY_CARE_PROVIDER_SITE_OTHER): Payer: Medicare Other | Admitting: Physician Assistant

## 2022-04-05 VITALS — BP 118/66 | HR 47 | Ht 71.0 in | Wt 244.0 lb

## 2022-04-05 DIAGNOSIS — I10 Essential (primary) hypertension: Secondary | ICD-10-CM | POA: Diagnosis not present

## 2022-04-05 DIAGNOSIS — I482 Chronic atrial fibrillation, unspecified: Secondary | ICD-10-CM | POA: Diagnosis not present

## 2022-04-05 DIAGNOSIS — I428 Other cardiomyopathies: Secondary | ICD-10-CM

## 2022-04-05 MED ORDER — METOPROLOL SUCCINATE ER 25 MG PO TB24: 12.5000 mg | ORAL_TABLET | Freq: Every day | ORAL | 3 refills | Status: DC

## 2022-04-05 MED ORDER — FUROSEMIDE 20 MG PO TABS: 20.0000 mg | ORAL_TABLET | Freq: Every day | ORAL | 3 refills | Status: DC

## 2022-04-05 NOTE — Patient Instructions (Signed)
Medication Instructions:  Decrease Metoprolol Succinate to 12.5 mg ( Take Half of 25 mg Tablet Daily). Decrease Lasix to 20 mg ( Take 1 Tablet Daily). *If you need a refill on your cardiac medications before your next appointment, please call your pharmacy*   Lab Work: No Labs If you have labs (blood work) drawn today and your tests are completely normal, you will receive your results only by: MyChart Message (if you have MyChart) OR A paper copy in the mail If you have any lab test that is abnormal or we need to change your treatment, we will call you to review the results.   Testing/Procedures: [address]. Your physician has requested that you have an echocardiogram. Echocardiography is a painless test that uses sound waves to create images of your heart. It provides your doctor with information about the size and shape of your heart and how well your heart's chambers and valves are working. This procedure takes approximately one hour. There are no restrictions for this procedure.    Follow-Up: At [facility], you and your health needs are our priority.  As part of our continuing mission to provide you with exceptional heart care, we have created designated Provider Care Teams.  These Care Teams include your primary Cardiologist (physician) and Advanced Practice Providers (APPs -  Physician Assistants and Nurse Practitioners) who all work together to provide you with the care you need, when you need it.  We recommend signing up for the patient portal called "MyChart".  Sign up information is provided on this After Visit Summary.  MyChart is used to connect with patients for Virtual Visits (Telemedicine).  Patients are able to view lab/test results, encounter notes, upcoming appointments, etc.  Non-urgent messages can be sent to your provider as well.   To learn more about what you can do with MyChart, go to [email or web address].    Your next appointment:   3  month(s)  The format for your next appointment:   In Person  Provider:   [name], MD     Other Instructions Monitor Blood Pressure and Heart Rate Log for 3 weeks send via MyChart message.  Important Information About Sugar

## 2022-04-19 ENCOUNTER — Ambulatory Visit (HOSPITAL_COMMUNITY): Payer: Medicare Other | Attending: Internal Medicine

## 2022-04-19 DIAGNOSIS — I482 Chronic atrial fibrillation, unspecified: Secondary | ICD-10-CM | POA: Diagnosis present

## 2022-04-19 DIAGNOSIS — I428 Other cardiomyopathies: Secondary | ICD-10-CM

## 2022-04-19 DIAGNOSIS — I10 Essential (primary) hypertension: Secondary | ICD-10-CM

## 2022-04-19 LAB — ECHOCARDIOGRAM COMPLETE
AR max vel: 1.8 cm2
AV Area VTI: 1.8 cm2
AV Area mean vel: 1.7 cm2
AV Mean grad: 18 mmHg
AV Peak grad: 32.5 mmHg
Ao pk vel: 2.85 m/s
MV M vel: 5.27 m/s
MV Peak grad: 111.1 mmHg
P 1/2 time: 530 msec
S' Lateral: 4.5 cm

## 2022-04-25 ENCOUNTER — Telehealth: Payer: Self-pay

## 2022-04-25 NOTE — Telephone Encounter (Signed)
Patient was returning your call. Ask that you call back after 4:30. Please advise

## 2022-04-25 NOTE — Telephone Encounter (Addendum)
Called patient regarding results. Left message for patient to call office.----- Message from Cannon Kettle, PA-C sent at 04/24/2022  7:28 AM EDT ----- Echocardiogram shows ejection fraction 45 to 50% (EF 40-45% on echo in 2020).  Normal right heart function.  Mild to moderate mitral valve regurgitation.  Mild aortic stenosis.  New central sleep apnea has not had an impact on overall heart squeeze (EF).

## 2022-04-26 ENCOUNTER — Telehealth: Payer: Self-pay | Admitting: Physician Assistant

## 2022-04-26 NOTE — Telephone Encounter (Signed)
Follow Up:      Patient is returning a call from yesterday. 

## 2022-04-27 ENCOUNTER — Telehealth: Payer: Self-pay

## 2022-04-27 NOTE — Telephone Encounter (Addendum)
Patient returned call to office. Patient had understanding of results.----- Message from Cannon Kettle, PA-C sent at 04/24/2022  7:28 AM EDT ----- Echocardiogram shows ejection fraction 45 to 50% (EF 40-45% on echo in 2020).  Normal right heart function.  Mild to moderate mitral valve regurgitation.  Mild aortic stenosis.  New central sleep apnea has not had an impact on overall heart squeeze (EF).

## 2022-06-15 ENCOUNTER — Ambulatory Visit: Payer: Medicare Other | Admitting: Cardiology

## 2022-07-02 NOTE — Progress Notes (Unsigned)
Cardiology Office Note   Date:  07/05/2022   ID:  Jake Johnson, DOB April 02, 1941, MRN [id number]  PCP:  Carloyn Manner, MD  Cardiologist:   Minus Breeding, MD   Chief Complaint  Patient presents with   Atrial Fibrillation       History of Present Illness: Jake Johnson is a 81 y.o. male who presents for follow up of atrial fib.  He also had a mildly reduced EF.  He had a Lexiscan Myoview negative for ischemia.  PYP was not suggestive of amyloid although technically equivocal.    Since I last saw him he did see Jake Johnson and he had his dose of Lasix reduced because of frequent urination the beta-blocker reduced because of bradycardia.  He is in A-fib and he does not notice this.  His blood pressure runs low so he does not allow med titration.  The patient denies any new symptoms such as chest discomfort, neck or arm discomfort. There has been no new shortness of breath, PND or orthopnea. There have been no reported palpitations, presyncope or syncope.   Past Medical History:  Diagnosis Date   AAA (abdominal aortic aneurysm) (HCC)    Atrial fibrillation ([place])    History of kidney stones    Hypertension    Sleep apnea    Does not use CPAP    Past Surgical History:  Procedure Laterality Date   LAMINECTOMY WITH POSTERIOR LATERAL ARTHRODESIS LEVEL 4 N/A 04/15/2019   Procedure: Posterior Lateral Fusion with segmental instrumentation Thoracic ten-Lumbar four;  Surgeon: Eustace Moore, MD;  Location: [facility];  Service: Neurosurgery;  Laterality: N/A;   NO PAST SURGERIES     WISDOM TOOTH EXTRACTION       Current Outpatient Medications  Medication Sig Dispense Refill   atorvastatin (LIPITOR) 40 MG tablet Take 40 mg by mouth at bedtime.     furosemide (LASIX) 20 MG tablet Take 1 tablet (20 mg total) by mouth daily. Can Take Additional Tablet for Swelling and Edema 90 tablet 3   latanoprost (XALATAN) 0.005 % ophthalmic solution Place 1 drop into both eyes at bedtime.      metoprolol succinate (TOPROL XL) 25 MG 24 hr tablet Take 0.5 tablets (12.5 mg total) by mouth daily. 45 tablet 3   Multiple Vitamin (MULTIVITAMIN WITH MINERALS) TABS tablet Take 1 tablet by mouth daily.     polyethylene glycol (MIRALAX / GLYCOLAX) 17 g packet Take 17 g by mouth daily.     rivaroxaban (XARELTO) 20 MG TABS tablet Take 1 tablet (20 mg total) by mouth daily with supper. Needs appointment with cardiologist for further refills 90 tablet 1   No current facility-administered medications for this visit.    Allergies:   Patient has no known allergies.    ROS:  Please see the history of present illness.   Otherwise, review of systems are positive for balance issues.   All other systems are reviewed and negative.    PHYSICAL EXAM: VS:  BP 128/70   Pulse 62   Ht 5\' 11"  (1.803 m)   Wt 251 lb 6.4 oz (114 kg)   SpO2 95%   BMI 35.06 kg/m  , BMI Body mass index is 35.06 kg/m. GENERAL:  Well appearing NECK:  No jugular venous distention, waveform within normal limits, carotid upstroke brisk and symmetric, no bruits, no thyromegaly LUNGS:  Clear to auscultation bilaterally CHEST:  Unremarkable HEART:  PMI not displaced or sustained,S1 and S2  within normal limits, no S3, no clicks, no rubs, soft apical systolic nonradiating, no diastolic murmurs, irregular  ABD:  Flat, positive bowel sounds normal in frequency in pitch, no bruits, no rebound, no guarding, no midline pulsatile mass, no hepatomegaly, no splenomegaly EXT:  2 plus pulses throughout, mild left greater than right leg edema, no cyanosis no clubbing   EKG:  EKG is not ordered today.    Recent Labs: No results found for requested labs within last 365 days.    Lipid Panel No results found for: "CHOL", "TRIG", "HDL", "CHOLHDL", "VLDL", "LDLCALC", "LDLDIRECT"    Wt Readings from Last 3 Encounters:  07/05/22 251 lb 6.4 oz (114 kg)  04/05/22 244 lb (110.7 kg)  05/24/21 244 lb 3.2 oz (110.8 kg)      Other studies  Reviewed: Additional studies/ records that were reviewed today include:  Labs Review of the above records demonstrates: See elsewhere   ASSESSMENT AND PLAN:  ATRIAL FIB:    . Mr. Jake Johnson has a CHA2DS2 - VASc score of 3.   He tolerates anticoagulation.  He is up-to-date with his hemoglobin and his creatinine.  He is on the appropriate dose.  CARDIOMYOPATHY:   He has had mildly reduced ejection fraction on echo in August 2023.  Blood pressure will not allow med titration.  No change in therapy.  HTN:   This is being managed in the context of treating his CHF.  I reviewed a blood pressure diary and his blood pressure is normal.  CONDUCTION ABNORMALITY: He has an abnormal EKG but no symptoms related to bradycardia arrhythmias.    EDEMA:   This is mild.  No change in therapy.  AAA: I do not have access to this tracing but he says it is followed by his primary provider.  MR: This is mild to moderate and I will follow this clinically    Current medicines are reviewed at length with the patient today.  The patient does not have concerns regarding medicines.  The following changes have been made:   None  Labs/ tests ordered today include:    None  No orders of the defined types were placed in this encounter.     Disposition:   FU with me in 12 months.     Signed, Minus Breeding, MD  07/05/2022 8:54 AM    [facility]

## 2022-07-05 ENCOUNTER — Ambulatory Visit: Payer: Medicare Other | Attending: Cardiology | Admitting: Cardiology

## 2022-07-05 ENCOUNTER — Encounter: Payer: Self-pay | Admitting: Cardiology

## 2022-07-05 ENCOUNTER — Telehealth: Payer: Self-pay

## 2022-07-05 VITALS — BP 128/70 | HR 62 | Ht 71.0 in | Wt 251.4 lb

## 2022-07-05 DIAGNOSIS — I10 Essential (primary) hypertension: Secondary | ICD-10-CM | POA: Diagnosis present

## 2022-07-05 DIAGNOSIS — M7989 Other specified soft tissue disorders: Secondary | ICD-10-CM | POA: Insufficient documentation

## 2022-07-05 DIAGNOSIS — I482 Chronic atrial fibrillation, unspecified: Secondary | ICD-10-CM | POA: Diagnosis present

## 2022-07-05 DIAGNOSIS — I428 Other cardiomyopathies: Secondary | ICD-10-CM | POA: Diagnosis present

## 2022-07-05 NOTE — Patient Instructions (Signed)
Medication Instructions:  Your physician recommends that you continue on your current medications as directed. Please refer to the Current Medication list given to you today.  *If you need a refill on your cardiac medications before your next appointment, please call your pharmacy*  Follow-Up: At [facility], you and your health needs are our priority.  As part of our continuing mission to provide you with exceptional heart care, we have created designated Provider Care Teams.  These Care Teams include your primary Cardiologist (physician) and Advanced Practice Providers (APPs -  Physician Assistants and Nurse Practitioners) who all work together to provide you with the care you need, when you need it.  We recommend signing up for the patient portal called "MyChart".  Sign up information is provided on this After Visit Summary.  MyChart is used to connect with patients for Virtual Visits (Telemedicine).  Patients are able to view lab/test results, encounter notes, upcoming appointments, etc.  Non-urgent messages can be sent to your provider as well.   To learn more about what you can do with MyChart, go to [email or web address].    Your next appointment:   12 month(s)  The format for your next appointment:   In Person  Provider:   James Hochrein, MD      

## 2022-07-05 NOTE — Telephone Encounter (Signed)
Pt in clinic today with Dr. Percival Spanish. Pt would like to speak with PharmD regarding best options for getting his prescription medications. He is ok with local or mail order. Pt had issues with current pharmacy trying to "sync up" refills of medication and not having adequate supply to fill full 90 day supply. PharmD unable to talk with pt while here. Will send message to PharmD pool to advise. Pt verbalizes understand and looks forward to phone call today.

## 2022-07-11 NOTE — Telephone Encounter (Signed)
LMOM

## 2022-07-11 NOTE — Telephone Encounter (Signed)
Patient returned call, wanted to clarify if it was okay to take Xarelto in the mornings rather than evenings.  Assured him this would be fine, as long as he takes with food.  Patient voiced understanding.

## 2022-08-29 ENCOUNTER — Telehealth: Payer: Self-pay | Admitting: Cardiology

## 2022-08-29 NOTE — Telephone Encounter (Signed)
Went to physical   Pt c/o medication issue:  1. Name of Medication:   metoprolol succinate (TOPROL XL) 25 MG 24 hr tablet    furosemide (LASIX) 20 MG tablet (Expired)    2. How are you currently taking this medication (dosage and times per day)?   3. Are you having a reaction (difficulty breathing--STAT)?   4. What is your medication issue? Pt spouse states that pt's pcp refilled his metoprolol for 50mg  and furosemide to 40mg , however she is unsure why it was increased. Please advise.

## 2022-08-29 NOTE — Telephone Encounter (Signed)
Contacted patient, advised that at his last visit with Dr.Hochrein in October, the PA decreased Metoprolol to 12.5 mg daily, and Lasix 20 mg daily (use extra tablet as needed for swelling) patient states this is how he was taking it. I advised that our medication list was correct, however PCP office is not within epic and may not have seen the changes. I advised patient to contact PCP office to notify of the changes so they can update their prescriptions and notes for any further visits.   Patient verbalized understanding, he wanted to make sure our side was correct.

## 2023-05-07 ENCOUNTER — Other Ambulatory Visit: Payer: Self-pay | Admitting: Physician Assistant

## 2023-05-09 ENCOUNTER — Other Ambulatory Visit: Payer: Self-pay | Admitting: Physician Assistant

## 2023-07-07 NOTE — Progress Notes (Unsigned)
Cardiology Office Note:   Date:  07/09/2023  ID:  Jake Johnson, DOB 1940-11-25, MRN [id number] PCP: Dalphine Handing, MD  [facility] Providers Cardiologist:  [name], MD {  History of Present Illness:   Jake Johnson is a 82 y.o. male who presents for follow up of atrial fib.  He also had a mildly reduced EF.  He had a Lexiscan Myoview negative for ischemia.  PYP was not suggestive of amyloid although technically equivocal.    Since I last saw him he did see [name] and he had his dose of Lasix reduced because of frequent urination the beta-blocker reduced because of bradycardia.  He is in A-fib and he does not notice this.    He presents for follow-up.  He is done relatively well. The patient denies any new symptoms such as chest discomfort, neck or arm discomfort. There has been no new shortness of breath, PND or orthopnea. There have been no reported palpitations, presyncope or syncope.  He might be having some trouble with the CPAP but he has an upcoming sleep MD appointment.    He does not feel his fibrillation.  He denies palpitations, presyncope or syncope.  He had no weight gain or 1 new edema.  He has some mild chronic lower extremity swelling.  ROS: As stated in the HPI and negative for all other systems.  Studies Reviewed:    EKG:   EKG Interpretation Date/Time:  Monday July 08 2023 12:39:26 EDT Ventricular Rate:  51 PR Interval:    QRS Duration:  174 QT Interval:  458 QTC Calculation: 422 R Axis:   -79  Text Interpretation: Atrial fibrillation with slow ventricular response Right bundle branch block Left anterior fascicular block Bifascicular block Septal infarct (cited on or before 08-Jul-2023) When compared with ECG of 09-Apr-2019 13:32, No significant change since last tracing Confirmed by [name] ([id number]) on 07/08/2023 12:58:54 PM    Risk Assessment/Calculations:    CHA2DS2-VASc Score = 4   This indicates a 4.8% annual risk of  stroke. The patient's score is based upon:       Physical Exam:   VS:  BP (!) 102/58 (BP Location: Left Arm, Patient Position: Sitting, Cuff Size: Normal)   Pulse (!) 51   Ht 5\' 11"  (1.803 m)   Wt 228 lb 9.6 oz (103.7 kg)   SpO2 97%   BMI 31.88 kg/m    Wt Readings from Last 3 Encounters:  07/08/23 228 lb 9.6 oz (103.7 kg)  07/05/22 251 lb 6.4 oz (114 kg)  04/05/22 244 lb (110.7 kg)     GEN: Well nourished, well developed in no acute distress NECK: No JVD; No carotid bruits CARDIAC: Irregular RR, no  murmurs, rubs, gallops RESPIRATORY:  Clear to auscultation without rales, wheezing or rhonchi  ABDOMEN: Soft, non-tender, non-distended EXTREMITIES:  No edema; No deformity   ASSESSMENT AND PLAN:   ATRIAL FIB:    . Mr. Jake Johnson has a CHA2DS2 - VASc score of 3.   He tolerates anticoagulation.  No change in therapy.  I will check some routine labs with a CBC and a c-Met.   Because of his low rate and actually going to stop his low-dose of metoprolol.  CARDIOMYOPATHY:   EF was 40 - 45% on echo in August.  He has no symptoms related to this.  He would not be able to tolerate med titration.  He has had some lower blood pressures and bradycardia.  HTN:  This is being managed in the context of treating his CHF.  As noted he is on minimal therapy.  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  I am going to give it with his beta-blocker and he will let me know if he ever has any presyncope or syncope.  EDEMA:    This is mild.  No change in therapy.  AAA:    He is not sure when he had this checked.  It was being ordered by his primary care physician.  Ongoing to take the liberty of ordering an ultrasound to follow-up on this.   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         Follow up me an APP for routine follow-up in 6 months and with me in a year.  Signed, [name], MD

## 2023-07-08 ENCOUNTER — Encounter: Payer: Self-pay | Admitting: Cardiology

## 2023-07-08 ENCOUNTER — Ambulatory Visit: Payer: Medicare Other | Attending: Cardiology | Admitting: Cardiology

## 2023-07-08 VITALS — BP 102/58 | HR 51 | Ht 71.0 in | Wt 228.6 lb

## 2023-07-08 DIAGNOSIS — M7989 Other specified soft tissue disorders: Secondary | ICD-10-CM

## 2023-07-08 DIAGNOSIS — I714 Abdominal aortic aneurysm, without rupture, unspecified: Secondary | ICD-10-CM

## 2023-07-08 DIAGNOSIS — I428 Other cardiomyopathies: Secondary | ICD-10-CM | POA: Diagnosis not present

## 2023-07-08 DIAGNOSIS — I482 Chronic atrial fibrillation, unspecified: Secondary | ICD-10-CM

## 2023-07-08 DIAGNOSIS — I10 Essential (primary) hypertension: Secondary | ICD-10-CM

## 2023-07-08 MED ORDER — RIVAROXABAN 20 MG PO TABS: 20.0000 mg | ORAL_TABLET | Freq: Every day | ORAL | 2 refills | Status: AC

## 2023-07-08 NOTE — Patient Instructions (Addendum)
Medication Instructions:   Stop taking  Metoprolol   *If you need a refill on your cardiac medications before your next appointment, please call your pharmacy*   Lab Work: Cbc cmp   If you have labs (blood work) drawn today and your tests are completely normal, you will receive your results only by: MyChart Message (if you have MyChart) OR A paper copy in the mail If you have any lab test that is abnormal or we need to change your treatment, we will call you to review the results.   Testing/Procedures: Will be schedule at [address] Your physician has requested that you have an abdominal aorta duplex. During this test, an ultrasound is used to evaluate the aorta. Allow 30 minutes for this exam. Do not eat after midnight the day before and avoid carbonated beverages    Follow-Up: At [facility], you and your health needs are our priority.  As part of our continuing mission to provide you with exceptional heart care, we have created designated Provider Care Teams.  These Care Teams include your primary Cardiologist (physician) and Advanced Practice Providers (APPs -  Physician Assistants and Nurse Practitioners) who all work together to provide you with the care you need, when you need it.  We recommend signing up for the patient portal called "MyChart".  Sign up information is provided on this After Visit Summary.  MyChart is used to connect with patients for Virtual Visits (Telemedicine).  Patients are able to view lab/test results, encounter notes, upcoming appointments, etc.  Non-urgent messages can be sent to your provider as well.   To learn more about what you can do with MyChart, go to [email or web address].    Your next appointment:   6 month(s)  The format for your next appointment:   In Person  Provider:   [name], MD

## 2023-07-09 ENCOUNTER — Other Ambulatory Visit: Payer: Self-pay | Admitting: Cardiology

## 2023-07-09 ENCOUNTER — Encounter: Payer: Self-pay | Admitting: Cardiology

## 2023-07-09 DIAGNOSIS — M7989 Other specified soft tissue disorders: Secondary | ICD-10-CM

## 2023-07-09 DIAGNOSIS — I10 Essential (primary) hypertension: Secondary | ICD-10-CM

## 2023-07-09 DIAGNOSIS — I428 Other cardiomyopathies: Secondary | ICD-10-CM

## 2023-07-09 DIAGNOSIS — I714 Abdominal aortic aneurysm, without rupture, unspecified: Secondary | ICD-10-CM

## 2023-07-09 DIAGNOSIS — I482 Chronic atrial fibrillation, unspecified: Secondary | ICD-10-CM

## 2023-07-09 LAB — CBC
Hematocrit: 38.7 % (ref 37.5–51.0)
Hemoglobin: 12.6 g/dL — ABNORMAL LOW (ref 13.0–17.7)
MCH: 28.8 pg (ref 26.6–33.0)
MCHC: 32.6 g/dL (ref 31.5–35.7)
MCV: 89 fL (ref 79–97)
Platelets: 139 10*3/uL — ABNORMAL LOW (ref 150–450)
RBC: 4.37 x10E6/uL (ref 4.14–5.80)
RDW: 13.1 % (ref 11.6–15.4)
WBC: 6.5 10*3/uL (ref 3.4–10.8)

## 2023-07-09 LAB — COMPREHENSIVE METABOLIC PANEL
ALT: 17 [IU]/L (ref 0–44)
AST: 22 [IU]/L (ref 0–40)
Albumin: 4.2 g/dL (ref 3.7–4.7)
Alkaline Phosphatase: 84 [IU]/L (ref 44–121)
BUN/Creatinine Ratio: 21 (ref 10–24)
BUN: 18 mg/dL (ref 8–27)
Bilirubin Total: 1.6 mg/dL — ABNORMAL HIGH (ref 0.0–1.2)
CO2: 24 mmol/L (ref 20–29)
Calcium: 10.9 mg/dL — ABNORMAL HIGH (ref 8.6–10.2)
Chloride: 106 mmol/L (ref 96–106)
Creatinine, Ser: 0.87 mg/dL (ref 0.76–1.27)
Globulin, Total: 3 g/dL (ref 1.5–4.5)
Glucose: 103 mg/dL — ABNORMAL HIGH (ref 70–99)
Potassium: 4.9 mmol/L (ref 3.5–5.2)
Sodium: 141 mmol/L (ref 134–144)
Total Protein: 7.2 g/dL (ref 6.0–8.5)
eGFR: 86 mL/min/{1.73_m2} (ref >59)

## 2023-07-26 ENCOUNTER — Ambulatory Visit (HOSPITAL_COMMUNITY)
Admission: RE | Admit: 2023-07-26 | Discharge: 2023-07-26 | Disposition: A | Discharge status: Home / Self Care | Payer: Medicare Other | Source: Ambulatory Visit | Attending: Internal Medicine | Admitting: Internal Medicine

## 2023-07-26 DIAGNOSIS — I714 Abdominal aortic aneurysm, without rupture, unspecified: Secondary | ICD-10-CM | POA: Diagnosis present

## 2023-07-31 ENCOUNTER — Encounter: Payer: Self-pay | Admitting: *Deleted

## 2024-01-29 NOTE — Progress Notes (Signed)
 Cardiology Office Note:   Date:  01/31/2024  ID:  Jake Johnson, DOB March 25, 1941, MRN [id number] PCP: Jake Anna, MD  [facility] Providers Cardiologist:  Eilleen Grates, MD {  History of Present Illness:   Jake Johnson is a 83 y.o. male who presents for follow up of atrial fib.  He also had a mildly reduced EF.  He had a Lexiscan  Myoview  negative for ischemia.  PYP was not suggestive of amyloid although technically equivocal.   He is in atrial fib.  Since I last saw him he fell off a ladder.  He has been managed for hypercalcemia and skin cancer.  The patient denies any new symptoms such as chest discomfort, neck or arm discomfort. There has been no new shortness of breath, PND or orthopnea. There have been no reported palpitations, presyncope or syncope.   He has a [place] puppy named Angus  ROS: As stated in the HPI and negative for all other systems.  Studies Reviewed:    EKG:     Atrial fibrillation, rate 51, right bundle branch block, left anterior fascicular block, no acute ST-T wave changes, 07/08/2023    Risk Assessment/Calculations:    CHA2DS2-VASc Score = 4   This indicates a 4.8% annual risk of stroke. The patient's score is based upon: CHF History: 1 HTN History: 1 Diabetes History: 0 Stroke History: 0 Vascular Disease History: 0 Age Score: 2 Gender Score: 0   Physical Exam:   VS:  BP 120/60   Pulse 87   Ht 5\' 11"  (1.803 m)   Wt 217 lb 12.8 oz (98.8 kg)   SpO2 98%   BMI 30.38 kg/m    Wt Readings from Last 3 Encounters:  01/31/24 217 lb 12.8 oz (98.8 kg)  07/08/23 228 lb 9.6 oz (103.7 kg)  07/05/22 251 lb 6.4 oz (114 kg)     GEN: Well nourished, well developed in no acute distress NECK: No JVD; No carotid bruits CARDIAC: Irregular RR, soft apical systolic slightly radiating murmur, no diastolic murmurs, rubs, gallops RESPIRATORY:  Clear to auscultation without rales, wheezing or rhonchi  ABDOMEN: Soft, non-tender,  non-distended EXTREMITIES:  No edema; No deformity   ASSESSMENT AND PLAN:   ATRIAL FIB:    . Mr. Kaelyn Nauta has a CHA2DS2 - VASc score of 3.   He tolerates anticoagulation.  At the last visit I stopped his metoprolol .  He is done fine with this.  CARDIOMYOPATHY:   EF was 40 - 45% on echo in August 2023.  I think this is nonischemic and not suspecting amyloid.  He had an equivocal PYP.  He is euvolemic.  No change in therapy.  HTN:   This is being managed in the context of treating his cardiomyopathy.  His blood pressure and heart rate would not allow significant med titration.    CONDUCTION ABNORMALITY:   He has bifascicular block.  He understands to present to the emergency room if he has any syncope or significant lightheadedness related to low heart rates.   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  No change in therapy.  AAA:    I did review the most recent ultrasound and he had no significant aneurysm.  No follow-up is needed.   MR: This has been mild to moderate and I will follow-up again clinically next year.   SLEEP APNEA: He does use his CPAP.  No change in therapy.  Follow up with me in 1 year.  Signed, Eilleen Grates,  MD

## 2024-01-31 ENCOUNTER — Ambulatory Visit: Attending: Cardiology | Admitting: Cardiology

## 2024-01-31 ENCOUNTER — Encounter: Payer: Self-pay | Admitting: Cardiology

## 2024-01-31 VITALS — BP 120/60 | HR 87 | Ht 71.0 in | Wt 217.8 lb

## 2024-01-31 DIAGNOSIS — I482 Chronic atrial fibrillation, unspecified: Secondary | ICD-10-CM

## 2024-01-31 DIAGNOSIS — I340 Nonrheumatic mitral (valve) insufficiency: Secondary | ICD-10-CM

## 2024-01-31 DIAGNOSIS — I714 Abdominal aortic aneurysm, without rupture, unspecified: Secondary | ICD-10-CM

## 2024-01-31 DIAGNOSIS — G4730 Sleep apnea, unspecified: Secondary | ICD-10-CM | POA: Diagnosis present

## 2024-01-31 NOTE — Patient Instructions (Signed)
 Medication Instructions:  The current medical regimen is effective;  continue present plan and medications.  *If you need a refill on your cardiac medications before your next appointment, please call your pharmacy*  Follow-Up: At [facility], you and your health needs are our priority.  As part of our continuing mission to provide you with exceptional heart care, our providers are all part of one team.  This team includes your primary Cardiologist (physician) and Advanced Practice Providers or APPs (Physician Assistants and Nurse Practitioners) who all work together to provide you with the care you need, when you need it.  Your next appointment:   1 year(s)  Provider:   [name], MD    We recommend signing up for the patient portal called "MyChart".  Sign up information is provided on this After Visit Summary.  MyChart is used to connect with patients for Virtual Visits (Telemedicine).  Patients are able to view lab/test results, encounter notes, upcoming appointments, etc.  Non-urgent messages can be sent to your provider as well.   To learn more about what you can do with MyChart, go to [email or web address].

## 2025-01-26 ENCOUNTER — Ambulatory Visit: Admitting: Cardiology
